# Patient Record
Sex: Female | Born: 1985 | Race: White | Hispanic: No | Marital: Married | Smoking: Never smoker
Health system: Southern US, Academic
[De-identification: ages and names within clinical notes are randomized; demographics above are authoritative.]

## PROBLEM LIST (undated history)

## (undated) DIAGNOSIS — G500 Trigeminal neuralgia: Secondary | ICD-10-CM

## (undated) DIAGNOSIS — K219 Gastro-esophageal reflux disease without esophagitis: Secondary | ICD-10-CM

## (undated) DIAGNOSIS — E079 Disorder of thyroid, unspecified: Secondary | ICD-10-CM

## (undated) DIAGNOSIS — E039 Hypothyroidism, unspecified: Secondary | ICD-10-CM

## (undated) DIAGNOSIS — C801 Malignant (primary) neoplasm, unspecified: Secondary | ICD-10-CM

## (undated) DIAGNOSIS — E119 Type 2 diabetes mellitus without complications: Secondary | ICD-10-CM

## (undated) DIAGNOSIS — G4730 Sleep apnea, unspecified: Secondary | ICD-10-CM

## (undated) DIAGNOSIS — J45909 Unspecified asthma, uncomplicated: Secondary | ICD-10-CM

## (undated) DIAGNOSIS — G8929 Other chronic pain: Secondary | ICD-10-CM

## (undated) HISTORY — DX: Hypothyroidism, unspecified: E03.9

## (undated) HISTORY — PX: HX LAP CHOLECYSTECTOMY: SHX56

## (undated) HISTORY — PX: HX TONSILLECTOMY: SHX27

---
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
## 2019-09-23 ENCOUNTER — Ambulatory Visit (INDEPENDENT_AMBULATORY_CARE_PROVIDER_SITE_OTHER): Payer: Self-pay

## 2020-11-15 ENCOUNTER — Other Ambulatory Visit (HOSPITAL_COMMUNITY): Payer: Self-pay | Admitting: PHYSICIAN ASSISTANT

## 2020-11-15 DIAGNOSIS — G500 Trigeminal neuralgia: Secondary | ICD-10-CM

## 2022-03-09 ENCOUNTER — Other Ambulatory Visit (HOSPITAL_COMMUNITY): Payer: Self-pay

## 2022-03-09 ENCOUNTER — Ambulatory Visit
Admission: RE | Admit: 2022-03-09 | Discharge: 2022-03-09 | Disposition: A | Discharge status: Home / Self Care | Payer: Medicaid Other | Source: Ambulatory Visit

## 2022-03-09 ENCOUNTER — Other Ambulatory Visit: Payer: Self-pay

## 2022-03-09 DIAGNOSIS — E039 Hypothyroidism, unspecified: Secondary | ICD-10-CM | POA: Insufficient documentation

## 2022-03-09 DIAGNOSIS — G4733 Obstructive sleep apnea (adult) (pediatric): Secondary | ICD-10-CM | POA: Insufficient documentation

## 2022-03-12 NOTE — Procedures (Unsigned)
Meghan Garner, Meghan Garner   HOSPITAL NUMBER:  [id number]  DATE OF SERVICE:  03/09/2022  DOB:  [birth year]  SEX:  F      PULMONARY FUNCTION TEST    SPIROMETRY:  Spirometry revealed normal flow vital capacity of 82% of predicted with normal flow rate of 86% with normal FEV1 of 2.8 liters.    LUNG VOLUMES:  Lung volumes are essentially unremarkable.    LUNG DIFFUSION:  Diffusion capacity is within normal limits with DLCO of 92% of predicted.    Flow-volume loop is essentially unremarkable.    IMPRESSION:  In conclusion, pulmonary function test is essentially unremarkable with normal FEV1 of 2.8 liters.        Renaye Rakers, MD            DD:  03/11/2022 13:08:21  DT:  03/12/2022 09:09:07 NW  D#:  [id number]

## 2022-05-05 ENCOUNTER — Other Ambulatory Visit (HOSPITAL_COMMUNITY): Payer: Self-pay | Admitting: Family Medicine

## 2022-05-05 ENCOUNTER — Other Ambulatory Visit: Payer: Self-pay

## 2022-05-05 DIAGNOSIS — R946 Abnormal results of thyroid function studies: Secondary | ICD-10-CM

## 2022-05-15 ENCOUNTER — Ambulatory Visit
Admission: RE | Admit: 2022-05-15 | Discharge: 2022-05-15 | Disposition: A | Discharge status: Home / Self Care | Payer: Medicaid Other | Source: Ambulatory Visit | Attending: Family Medicine | Admitting: Family Medicine

## 2022-05-15 ENCOUNTER — Ambulatory Visit (HOSPITAL_COMMUNITY)
Admission: RE | Admit: 2022-05-15 | Discharge: 2022-05-15 | Disposition: A | Discharge status: Home / Self Care | Payer: Medicaid Other | Source: Ambulatory Visit | Attending: Family Medicine | Admitting: Family Medicine

## 2022-05-15 ENCOUNTER — Ambulatory Visit (HOSPITAL_COMMUNITY): Payer: Self-pay

## 2022-05-15 ENCOUNTER — Other Ambulatory Visit: Payer: Self-pay

## 2022-05-15 DIAGNOSIS — R946 Abnormal results of thyroid function studies: Secondary | ICD-10-CM | POA: Insufficient documentation

## 2022-05-16 ENCOUNTER — Ambulatory Visit
Admission: RE | Admit: 2022-05-16 | Discharge: 2022-05-16 | Disposition: A | Discharge status: Home / Self Care | Payer: Medicaid Other | Source: Ambulatory Visit | Attending: Family Medicine | Admitting: Family Medicine

## 2022-05-16 DIAGNOSIS — R946 Abnormal results of thyroid function studies: Secondary | ICD-10-CM

## 2022-09-05 ENCOUNTER — Ambulatory Visit (HOSPITAL_COMMUNITY): Payer: Self-pay | Admitting: NURSE PRACTITIONER

## 2022-09-21 ENCOUNTER — Ambulatory Visit
Admission: RE | Admit: 2022-09-21 | Discharge: 2022-09-21 | Disposition: A | Discharge status: Home / Self Care | Payer: Medicaid Other | Source: Ambulatory Visit | Attending: Family Medicine | Admitting: Family Medicine

## 2022-09-21 ENCOUNTER — Other Ambulatory Visit: Payer: Self-pay

## 2022-09-21 ENCOUNTER — Other Ambulatory Visit (HOSPITAL_COMMUNITY): Payer: Self-pay | Admitting: Family Medicine

## 2022-09-21 DIAGNOSIS — R059 Cough, unspecified: Secondary | ICD-10-CM

## 2022-09-21 DIAGNOSIS — R0602 Shortness of breath: Secondary | ICD-10-CM

## 2023-05-31 ENCOUNTER — Telehealth (HOSPITAL_BASED_OUTPATIENT_CLINIC_OR_DEPARTMENT_OTHER): Payer: Self-pay | Admitting: NURSE PRACTITIONER

## 2023-05-31 NOTE — Telephone Encounter (Signed)
Refill Request:    Pt states they need a PA for both since her insurance has changed to Medicare.     Armour Thyroid 120 mg &     Armour Thyroid 30 mg     Patient states she has tried others (Levothyroxine, Synthroid, MP Thyroid but they did not work for her. She does not want to go back to those.   Kaslyn states she has 5 remaining doses remaining.    Pt can be reached @ [phone]

## 2023-06-05 NOTE — Telephone Encounter (Signed)
Prior authorization has been submitted via cover my meds for both the Armour Thyroid 120mg  and 30 mg Tablets. Called the pharmacy to get billing insurance information for her new insurance. RXBIN: [id number], RXPCN: [id number], [id number]: [id number], ID# [id number]. Waiting on response from the insurance.

## 2023-06-12 NOTE — Telephone Encounter (Signed)
Insurance will not cover the Armour Thyroid. It was denied due to being a plan exclusion. I know the patient has tried other brands as well. Do you want to switch her medication? I also spoke with the pharmacy just to see if there were any coupons that the patient could use, unfortunately there is not. Where she has medicare, it knocks her out of any of those coupons/discounts.

## 2023-06-17 NOTE — Telephone Encounter (Signed)
I tried to call patient and could not reach her. She has not been seen since 08/2022. No recent labwork. Insurance denied Armour Thyroid. Please try to move her appointment up, she is scheduled in August. Call her to see if she has enough medication to last her until appointment. Thanks.

## 2023-08-15 ENCOUNTER — Other Ambulatory Visit (HOSPITAL_BASED_OUTPATIENT_CLINIC_OR_DEPARTMENT_OTHER): Payer: Self-pay | Admitting: NURSE PRACTITIONER

## 2023-08-15 DIAGNOSIS — E039 Hypothyroidism, unspecified: Secondary | ICD-10-CM

## 2023-08-16 ENCOUNTER — Encounter (HOSPITAL_BASED_OUTPATIENT_CLINIC_OR_DEPARTMENT_OTHER): Payer: Self-pay | Admitting: NURSE PRACTITIONER

## 2023-08-16 ENCOUNTER — Inpatient Hospital Stay: Admission: RE | Admit: 2023-08-16 | Payer: Medicare Other | Source: Ambulatory Visit

## 2023-08-16 ENCOUNTER — Other Ambulatory Visit: Payer: Self-pay

## 2023-08-16 DIAGNOSIS — K219 Gastro-esophageal reflux disease without esophagitis: Secondary | ICD-10-CM | POA: Insufficient documentation

## 2023-08-16 DIAGNOSIS — I10 Essential (primary) hypertension: Secondary | ICD-10-CM | POA: Insufficient documentation

## 2023-08-16 DIAGNOSIS — R300 Dysuria: Secondary | ICD-10-CM | POA: Insufficient documentation

## 2023-08-16 DIAGNOSIS — J329 Chronic sinusitis, unspecified: Secondary | ICD-10-CM | POA: Insufficient documentation

## 2023-08-16 DIAGNOSIS — J342 Deviated nasal septum: Secondary | ICD-10-CM | POA: Insufficient documentation

## 2023-08-16 DIAGNOSIS — E039 Hypothyroidism, unspecified: Secondary | ICD-10-CM | POA: Insufficient documentation

## 2023-08-16 DIAGNOSIS — J309 Allergic rhinitis, unspecified: Secondary | ICD-10-CM | POA: Insufficient documentation

## 2023-08-16 DIAGNOSIS — N920 Excessive and frequent menstruation with regular cycle: Secondary | ICD-10-CM | POA: Insufficient documentation

## 2023-08-16 DIAGNOSIS — M1990 Unspecified osteoarthritis, unspecified site: Secondary | ICD-10-CM | POA: Insufficient documentation

## 2023-08-16 DIAGNOSIS — J343 Hypertrophy of nasal turbinates: Secondary | ICD-10-CM | POA: Insufficient documentation

## 2023-08-16 DIAGNOSIS — F32A Depression, unspecified: Secondary | ICD-10-CM | POA: Insufficient documentation

## 2023-08-17 ENCOUNTER — Encounter (HOSPITAL_BASED_OUTPATIENT_CLINIC_OR_DEPARTMENT_OTHER): Payer: Self-pay | Admitting: NURSE PRACTITIONER

## 2023-08-17 ENCOUNTER — Ambulatory Visit (HOSPITAL_BASED_OUTPATIENT_CLINIC_OR_DEPARTMENT_OTHER): Payer: Self-pay | Admitting: NURSE PRACTITIONER

## 2023-08-17 VITALS — BP 160/91 | HR 102 | Temp 98.2°F | Resp 16 | Ht 67.0 in | Wt 323.0 lb

## 2023-08-17 DIAGNOSIS — R000 Tachycardia, unspecified: Secondary | ICD-10-CM

## 2023-08-17 DIAGNOSIS — E039 Hypothyroidism, unspecified: Secondary | ICD-10-CM | POA: Insufficient documentation

## 2023-08-17 DIAGNOSIS — E559 Vitamin D deficiency, unspecified: Secondary | ICD-10-CM | POA: Insufficient documentation

## 2023-08-17 DIAGNOSIS — Z6841 Body Mass Index (BMI) 40.0 and over, adult: Secondary | ICD-10-CM | POA: Insufficient documentation

## 2023-08-17 DIAGNOSIS — E042 Nontoxic multinodular goiter: Secondary | ICD-10-CM | POA: Insufficient documentation

## 2023-08-17 DIAGNOSIS — I10 Essential (primary) hypertension: Secondary | ICD-10-CM | POA: Insufficient documentation

## 2023-08-17 DIAGNOSIS — Z79890 Hormone replacement therapy (postmenopausal): Secondary | ICD-10-CM

## 2023-08-17 NOTE — Progress Notes (Signed)
[facility]  [address]  [place] [address]  Operated by [facility]    Name: Meghan Garner MRN:  [id number]   Date: 08/17/2023 DOB:  1986/04/06 (36 y.o.)     PCP: Myra Gianotti Hill-Hurt, PA-C  Referring Provider: Mariah Milling     Chief Complaint: Hypothyroidism (Last ov 09/05/22)    HPI    Last appointment was 09/05/2022. Taking Armour Thyroid 120 mg. Daily. Receiving from company through PCP. Unable to get Armour Thyroid 120 mg. and 30 mg. Prior Berkley Harvey was denied. She says she has been taking thyroid medication since she was 37 years old. She says she has taken Synthroid, Levothyroxine, NP Thyroid in the past, prefers Armour Thyroid but is willing to change since unable to get dosage she needs from company. PCP is [facility]. Thyroid scan and uptake ordered by her PCP 04/2022 showing activity in right thyroid lobe greater than the left, decreased activity right mid gland. Low radioiodine uptake at all time points, 4.5% at 24 hours. Patient states she thinks she stopped taking Armour thyroid prior to scan and uptake. TSH 16.800, free T4 0.4 from 05/2023.     Thyroid ultrasound 07/2023 showing 1.6 cm. Thyroid nodule right side, this is her first ultrasound. Also, Nearly isoechoic 1.1 and 1.1 cm nodules are seen in the right lower thyroid gland. Similar appearing 1.0 cm solid nearly isoechoic nodule in the left lower gland. She denies family history of thyroid cancer and denies prolonged radiation exposure to her neck.     HgbA1C 6.1% from 07/2022. HgbA1C 5.7% from 05/2023. She says PCP has been managing her Diabetes. Taking Metformin 500 mg. ER daily.        No recent Vitamin D level. She says she has taken Vitamin D 50,000 units weekly in the past.     Past Medical History:  Past Medical History:   Diagnosis Date    Hypothyroid          Past Surgical History:   Procedure Laterality Date    HX LAP CHOLECYSTECTOMY      HX TONSILLECTOMY           Baclofen  (LIORESAL) 20 mg Oral Tablet, Take 1 Tablet (20 mg total) by mouth Three times a day  buPROPion (WELLBUTRIN SR) 150 mg Oral tablet sustained-release 12 hr, Take 1 Tablet (150 mg total) by mouth Twice daily  OXcarbazepine (TRILEPTAL) 300 mg Oral Tablet, ON DAY 1-3 TAKE 1 CAPSULE BY MOUTH TWICE A DAY, ON DAY 4-6 TAKE 1 CAPSULE IN THE AM AND 2 CAPS IN THE EVENING, ON DAY 7-9 TAKE 2 CAPS TWICE A DAY, ON DAY 10-12 TAKE 2 CAPS IN THE AM AND 3 CAPS IN THE EVENING, ON DAY 13 AND BEYOND TAKE 3 CAPS TWICE A DAY  pantoprazole (PROTONIX) 40 mg Oral Tablet, Delayed Release (E.C.), Take 1 Tablet (40 mg total) by mouth Once a day  spironolactone (ALDACTONE) 50 mg Oral Tablet, Take 1 Tablet (50 mg total) by mouth Every morning with breakfast  thyroid (ARMOUR THYROID) 120 mg Oral Tablet, Take 1 Tablet (120 mg total) by mouth Once a day  cholecalciferol, vitamin D3, 25 mcg (1,000 unit) Oral Tablet, Take 1 Tablet (1,000 Units total) by mouth Once a day (Patient not taking: Reported on 08/17/2023)  gabapentin (NEURONTIN) 400 mg Oral Capsule, Take 1 Capsule (400 mg total) by mouth (Patient not taking: Reported on 08/17/2023)  Metformin (GLUMETZA) 500 mg Oral Tablet,SR,Gast.Retention,24 hr, Take 1  Tablet (500 mg total) by mouth Once a day (Patient not taking: Reported on 08/17/2023)  Minocycline (DYNACIN) 75 mg Oral Tablet, Take 1 Tablet (75 mg total) by mouth Once a day (Patient not taking: Reported on 08/17/2023)  montelukast (SINGULAIR) 10 mg Oral Tablet, Take 1 Tablet (10 mg total) by mouth Every evening (Patient not taking: Reported on 08/17/2023)  propranoloL (INDERAL) 40 mg Oral Tablet, Take 1 Tablet (40 mg total) by mouth Three times a day (Patient not taking: Reported on 08/17/2023)  thyroid (ARMOUR THYROID) 30 mg Oral Tablet, Take 1 Tablet (30 mg total) by mouth Once a day (Patient not taking: Reported on 08/17/2023)    No facility-administered medications prior to visit.     Allergies   Allergen Reactions    Cephalosporins  Anaphylaxis    Penicillins Anaphylaxis    Metformin Rash    Sulfa (Sulfonamides) Rash     Family Medical History:    None         Social History     Socioeconomic History    Marital status: Married     Spouse name: Not on file    Number of children: Not on file    Years of education: Not on file    Highest education level: Not on file   Occupational History    Not on file   Tobacco Use    Smoking status: Not on file    Smokeless tobacco: Not on file   Substance and Sexual Activity    Alcohol use: Not on file    Drug use: Not on file    Sexual activity: Not on file   Other Topics Concern    Not on file   Social History Narrative    Not on file     Social Determinants of Health     Financial Resource Strain: Not on file   Transportation Needs: Not on file   Social Connections: Not on file   Intimate Partner Violence: Not on file   Housing Stability: Not on file        There is no immunization history on file for this patient.    Review of Systems - See HPI       Physical Exam  Constitutional:       Appearance: Normal appearance.   HENT:      Head: Normocephalic and atraumatic.   Eyes:      Conjunctiva/sclera: Conjunctivae normal.   Cardiovascular:      Rate and Rhythm: Regular rhythm. Tachycardia present.      Pulses: Normal pulses.      Heart sounds: Normal heart sounds.   Pulmonary:      Effort: Pulmonary effort is normal.      Breath sounds: Normal breath sounds.   Musculoskeletal:         General: Normal range of motion.   Skin:     General: Skin is warm and dry.   Neurological:      General: No focal deficit present.      Mental Status: She is oriented to person, place, and time.   Psychiatric:         Mood and Affect: Mood normal.         Behavior: Behavior normal.          Vitals:   Vitals:    08/17/23 1039   BP: (!) 160/91   Pulse: (!) 102   Resp: 16   Temp: 36.8 C (98.2 F)   SpO2:  97%   Weight: (!) 147 kg (323 lb)   Height: 1.702 m (5\' 7" )   BMI: 50.69     Assessment and Plan:     Monitor BP and HR at home.  Follow up with PCP if BP and HR remain elevated. Labwork ASAP and before next appointment. Discussed changing to Levothyroxine and Liothyronine or changing to higher dose and cutting pill in half, pending lab results. Referral to Dr. Waymon Budge for FNA of thyroid nodule. Work on [employer] and exercise.     Encounter Diagnoses   Name Primary?    Hypothyroidism, unspecified type Yes    Morbid obesity with BMI of 50.0-59.9, adult (CMS [facility])     Essential hypertension     Vitamin D deficiency     Multiple thyroid nodules         Return in about 3 months (around 11/17/2023).      Wilmer Floor, FNP-C  08/17/2023, 10:52

## 2023-08-17 NOTE — Patient Instructions (Signed)
Monitor BP and HR at home. Follow up with PCP if BP and HR remain elevated. Labwork ASAP and before next appointment. Discussed changing to Levothyroxine and Liothyronine or changing to higher dose and cutting pill in half, pending lab results. Referral to Dr. Waymon Budge for FNA of thyroid nodule. Work on [employer] and exercise.

## 2023-08-20 ENCOUNTER — Other Ambulatory Visit: Payer: Medicare Other | Attending: NURSE PRACTITIONER

## 2023-08-20 ENCOUNTER — Other Ambulatory Visit: Payer: Self-pay

## 2023-08-20 ENCOUNTER — Other Ambulatory Visit (HOSPITAL_BASED_OUTPATIENT_CLINIC_OR_DEPARTMENT_OTHER): Payer: Self-pay | Admitting: INTERNAL MEDICINE-ENDOCRINOLOGY-DIABETES AND METABOLISM

## 2023-08-20 DIAGNOSIS — E039 Hypothyroidism, unspecified: Secondary | ICD-10-CM | POA: Insufficient documentation

## 2023-08-20 DIAGNOSIS — E559 Vitamin D deficiency, unspecified: Secondary | ICD-10-CM | POA: Insufficient documentation

## 2023-08-20 DIAGNOSIS — Z6841 Body Mass Index (BMI) 40.0 and over, adult: Secondary | ICD-10-CM | POA: Insufficient documentation

## 2023-08-20 LAB — THYROID STIMULATING HORMONE WITH FREE T4 REFLEX: TSH: 23.065 u[IU]/mL — ABNORMAL HIGH (ref 0.450–5.330)

## 2023-08-20 LAB — THYROXINE, FREE (FREE T4): THYROXINE (T4), FREE: <0.25 ng/dL — ABNORMAL LOW (ref 0.58–1.64)

## 2023-08-20 LAB — VITAMIN D 25 TOTAL: VITAMIN D 25, TOTAL: 35.39 ng/mL (ref 30.00–100.00)

## 2023-08-22 LAB — T3 (TRIIODOTHYRONINE), FREE, SERUM: T3 FREE: 2.4 pg/mL (ref 1.7–3.7)

## 2023-08-26 ENCOUNTER — Other Ambulatory Visit (HOSPITAL_BASED_OUTPATIENT_CLINIC_OR_DEPARTMENT_OTHER): Payer: Self-pay | Admitting: NURSE PRACTITIONER

## 2023-08-26 MED ORDER — THYROID (PORK) 300 MG TABLET
150.0000 mg | ORAL_TABLET | Freq: Every day | ORAL | Status: DC
Start: 2023-08-26 — End: 2023-11-30

## 2023-08-26 MED ORDER — ERGOCALCIFEROL (VITAMIN D2) 1,250 MCG (50,000 UNIT) CAPSULE
50000.0000 [IU] | ORAL_CAPSULE | ORAL | 1 refills | Status: AC
Start: 2023-08-26 — End: ?

## 2023-09-20 ENCOUNTER — Encounter (HOSPITAL_BASED_OUTPATIENT_CLINIC_OR_DEPARTMENT_OTHER): Payer: Self-pay | Admitting: NURSE PRACTITIONER

## 2023-09-26 ENCOUNTER — Other Ambulatory Visit (HOSPITAL_COMMUNITY): Payer: Self-pay | Admitting: Family Medicine

## 2023-09-26 DIAGNOSIS — G500 Trigeminal neuralgia: Secondary | ICD-10-CM

## 2023-09-27 ENCOUNTER — Other Ambulatory Visit (HOSPITAL_COMMUNITY): Payer: Self-pay | Admitting: Family Medicine

## 2023-09-27 DIAGNOSIS — G500 Trigeminal neuralgia: Secondary | ICD-10-CM

## 2023-10-08 IMAGING — MR MRI BRAIN W/O CONTRAST
8 of 10 series · 34 of 48 positions shown · IV contrast (gadolinium)
Comparison: None available.

﻿EXAM:  MRI BRAIN W/O CONTRAST
INDICATION: Trigeminal neuralgia.  Headaches.
TECHNIQUE: Multiplanar, multisequential MRI of the brain was performed without gadolinium contrast.

[Series 6: DWI · axial · 6.0mm · 1.35mm/px · z∈[-82,+72]mm · 9 of 92 slices shown (1 of 3)]
[im 1/92]
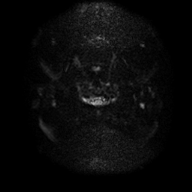
[im 16/92]
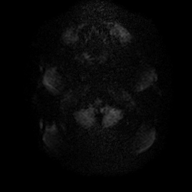
[im 31/92]
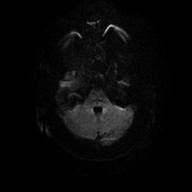
[im 38/92]
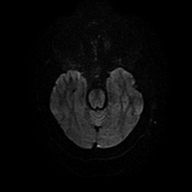
[im 46/92]
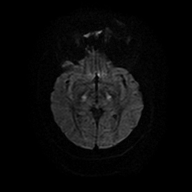
[im 54/92]
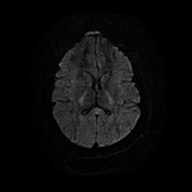
[im 61/92]
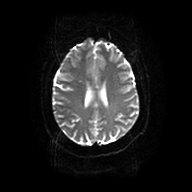
[im 76/92]
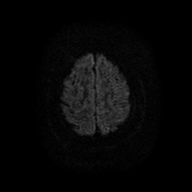
[im 92/92]
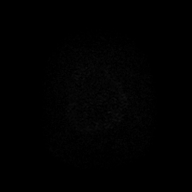

[Series 7: DWI · axial · 6.0mm · 1.35mm/px · z∈[-82,+72]mm · 3 of 23 slices shown (2 of 3)]
[im 1/23]
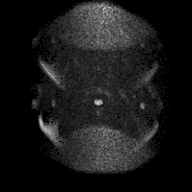
[im 12/23]
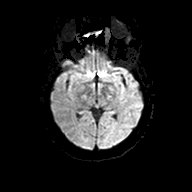
[im 23/23]
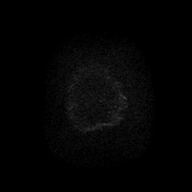

[Series 8: DWI · axial · 6.0mm · 1.35mm/px · z∈[-82,+72]mm · 3 of 23 slices shown (3 of 3)]
[im 1/23]
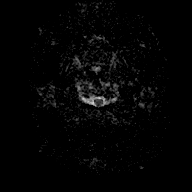
[im 12/23]
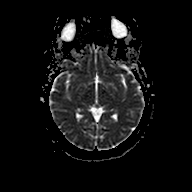
[im 23/23]
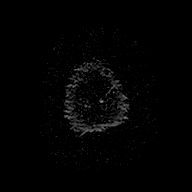

[Series 9: FLAIR · sagittal · 4.5mm · 0.75mm/px · 3 of 26 slices shown (1 of 2)]
[im 1/26]
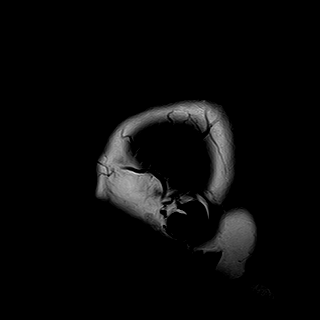
[im 13/26]
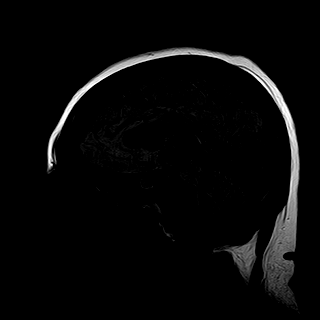
[im 26/26]
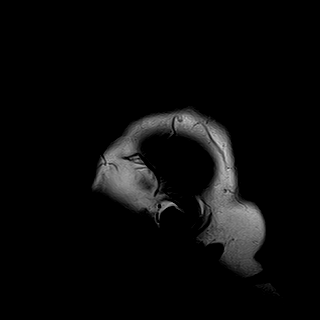

[Series 10: T2 · axial · 4.5mm · 0.43mm/px · z∈[-86,+71]mm · 5 of 36 slices shown]
[im 1/36]
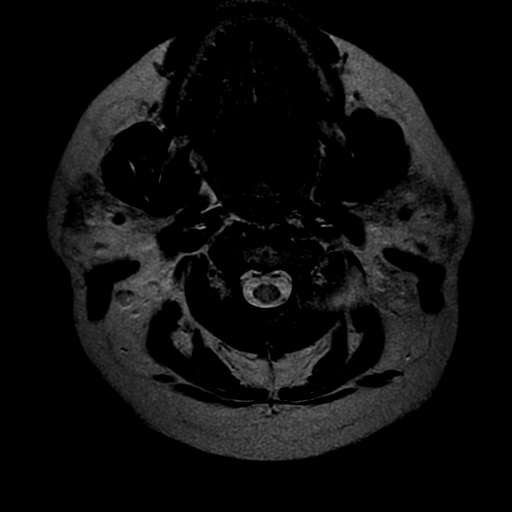
[im 9/36]
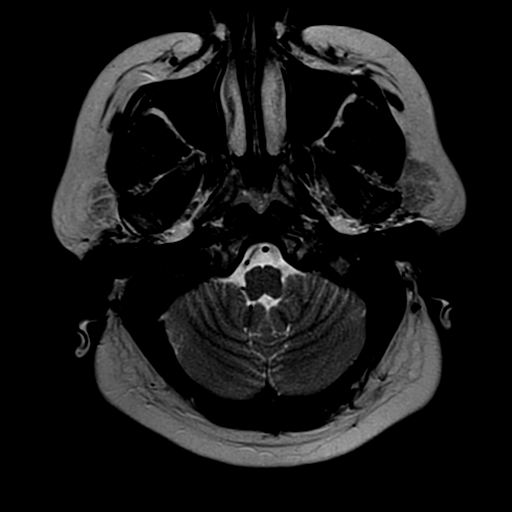
[im 18/36]
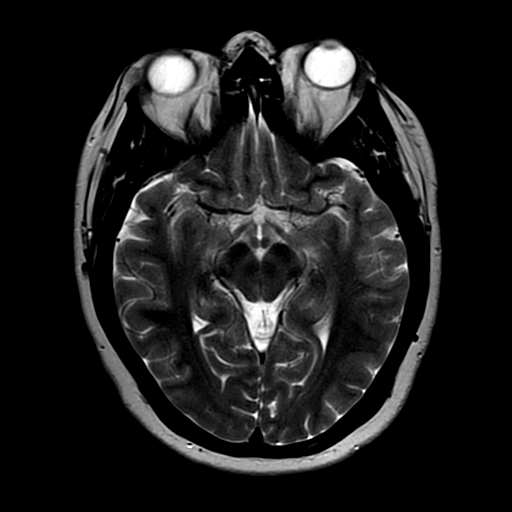
[im 27/36]
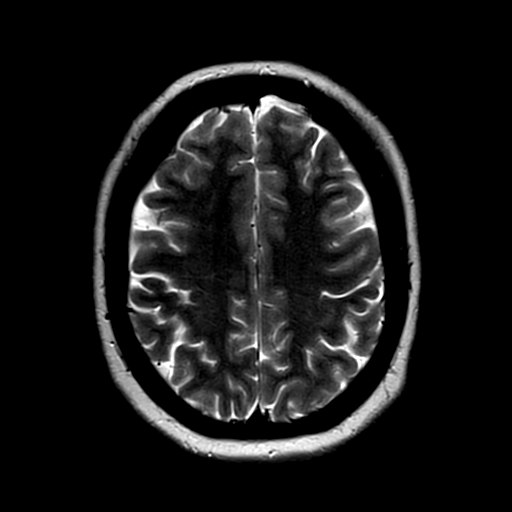
[im 36/36]
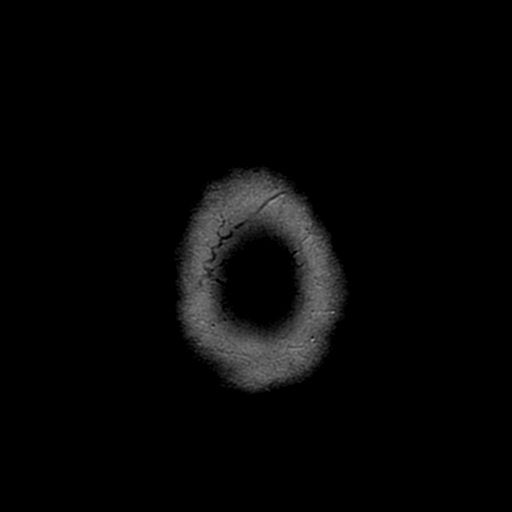

[Series 11: FLAIR · axial · 4.5mm · 0.43mm/px · z∈[-83,+75]mm · 5 of 36 slices shown (2 of 2)]
[im 1/36]
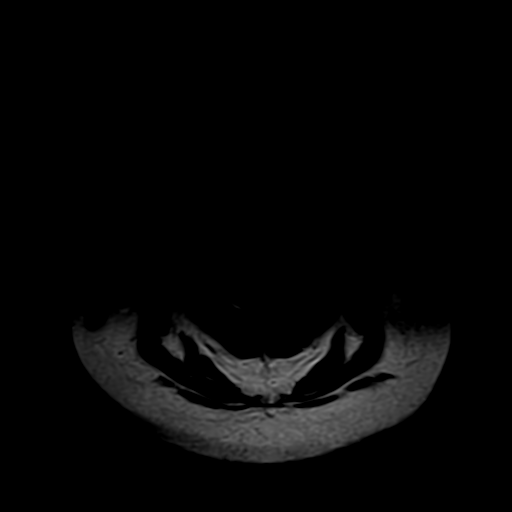
[im 9/36]
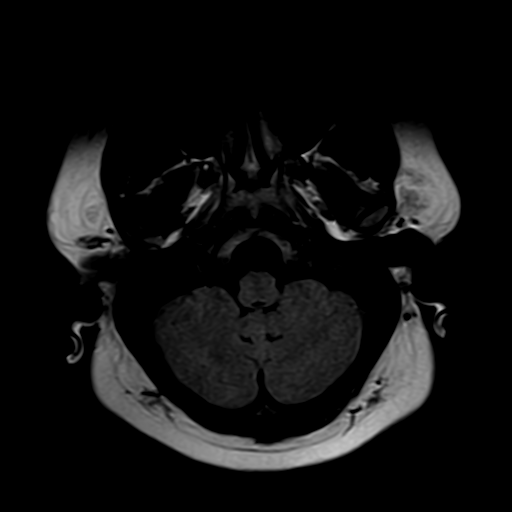
[im 18/36]
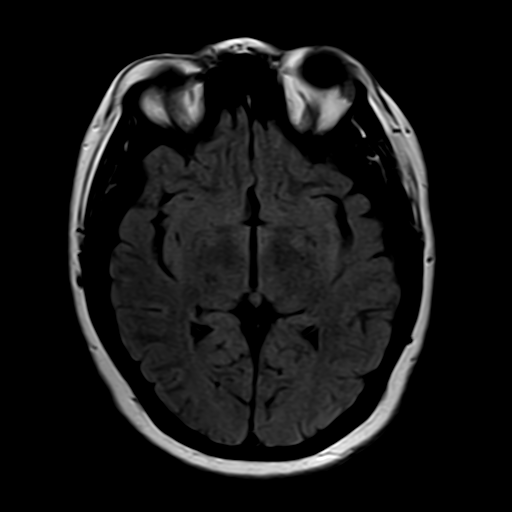
[im 27/36]
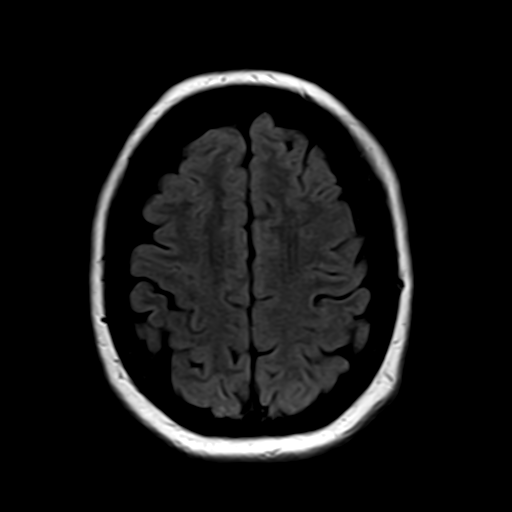
[im 36/36]
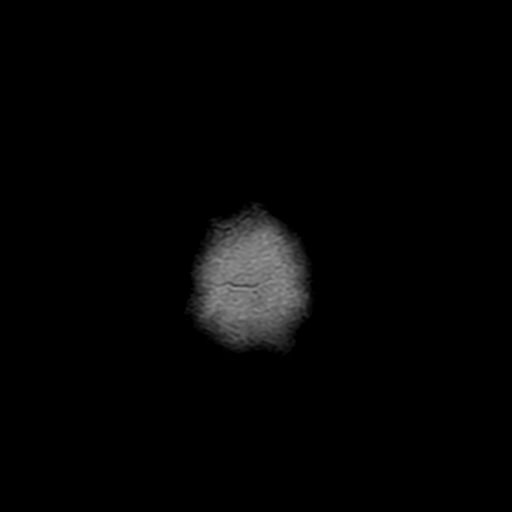

[Series 12: T1 · axial · 4.5mm · 0.43mm/px · z∈[-89,+68]mm · 5 of 36 slices shown (1 of 2)]
[im 1/36]
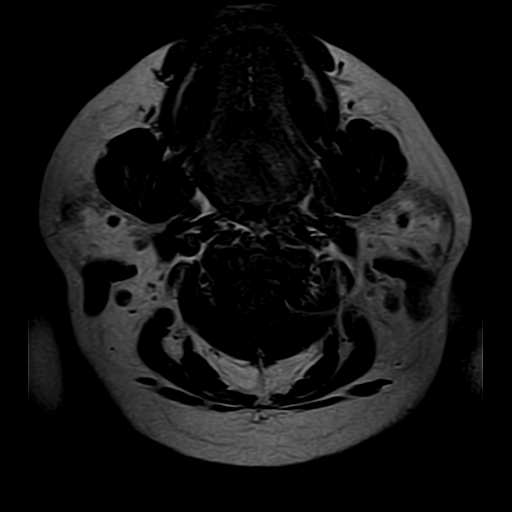
[im 9/36]
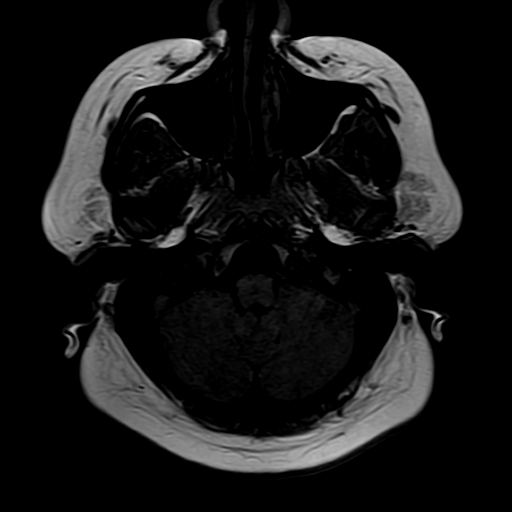
[im 18/36]
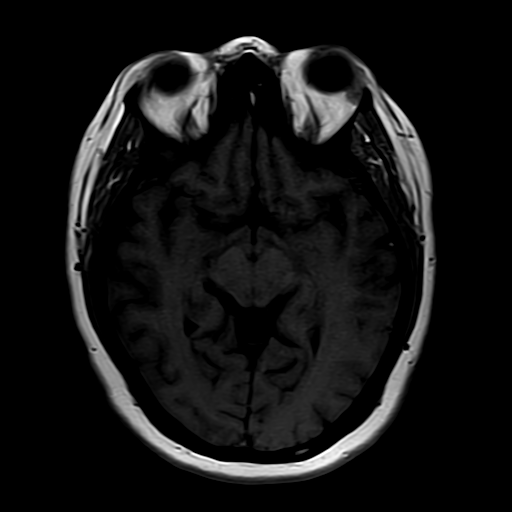
[im 27/36]
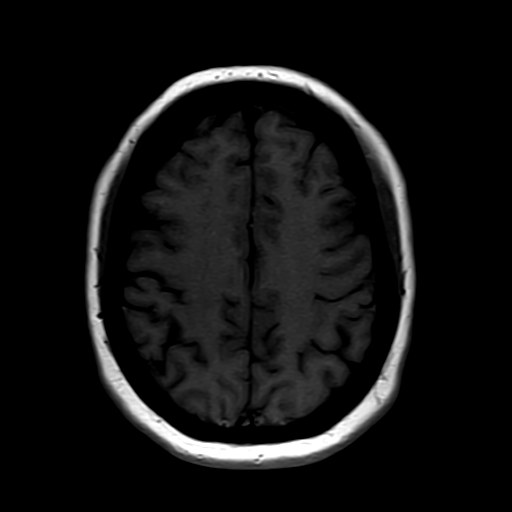
[im 36/36]
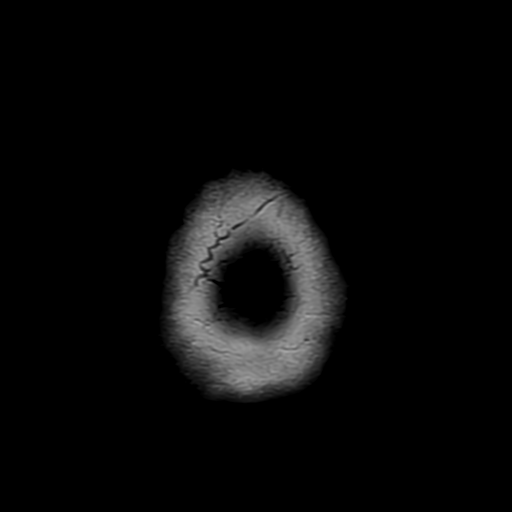

[Series 15: T1 · axial · 3.0mm · 0.47mm/px · 1 of 11 slices shown (2 of 2)]
[im 1/11]
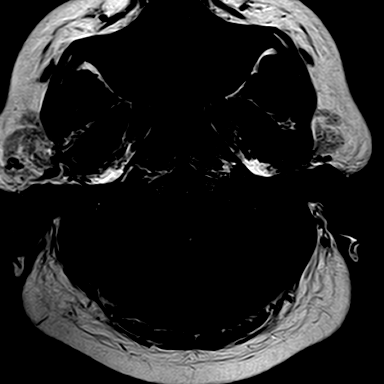

[34 of 48 positions shown; findings below may reference images not displayed]

FINDINGS: There is no evidence of mass, hemorrhage, shift of the midline structures, or extra-axial collection.  The ventricles and CSF spaces appear within normal limits. The visualized cranial nerves appear unremarkable.  

The paranasal sinuses and mastoid air cells appear clear.  The orbits appear unremarkable.
IMPRESSION: Normal examination.

## 2023-10-12 ENCOUNTER — Ambulatory Visit: Payer: Medicare Other | Attending: GENERAL SURGERY | Admitting: GENERAL SURGERY

## 2023-10-12 ENCOUNTER — Ambulatory Visit (HOSPITAL_BASED_OUTPATIENT_CLINIC_OR_DEPARTMENT_OTHER): Payer: Medicare Other

## 2023-10-12 ENCOUNTER — Encounter (HOSPITAL_BASED_OUTPATIENT_CLINIC_OR_DEPARTMENT_OTHER): Payer: Self-pay | Admitting: GENERAL SURGERY

## 2023-10-12 ENCOUNTER — Other Ambulatory Visit: Payer: Self-pay

## 2023-10-12 VITALS — BP 136/80 | HR 89 | Temp 98.7°F | Resp 18 | Ht 67.0 in | Wt 334.2 lb

## 2023-10-12 DIAGNOSIS — E042 Nontoxic multinodular goiter: Secondary | ICD-10-CM | POA: Insufficient documentation

## 2023-10-12 NOTE — H&P (Signed)
[facility]  [address]  [place] [address]  Operated by [facility]     Name: Meghan Garner MRN:  [id number]   Date of Birth: [birth year] Age: 37 y.o.   Date: 10/12/2023       Provider: Nicoletta Dress, MD    PCP: Myra Gianotti Hill-Hurt, PA-C    Referring Provider: Wilmer Floor     Reason for visit: Thyroid Nodule(s) (New patient. Thyroid US and possible FNA. )      HPI:  Nursing Notes:   Vergia Alberts, MA  10/12/23 1236  Signed  Meghan Garner is a 37 year old female who is here today as a new patient for evaluation of thyroid nodules by ultrasound and possible FNA. The patient was referred to our office by  Kathlyn [place]. The patient states that her PCP sent her for an ultrasound due to her being diagnosed with hypothyroidism. The patient is currently taking Armorthyroid 150.    The patient admits to fatigue and weight gain.  The patient denies heat or cold intolerance, choking sensations, bone pain, bone fractures, head or neck radiation. The patient admits to family history of thyroid cancer in her paternal great grandmother.     The patient had a recent thyroid ultrasound performed 08/16/23. The findings of that ultrasound were:    Right - 5.3 x 1.8 x 1.5 cm  Multiple nodules bilaterally.    Left - 3.7 x 1.3 x 1.3 cm    Isthmus - 4 mm    Right:    17 x 13 x 11 mm - complex  11 x 6 x 7 mm - Hypoechoic  11 x 11 x 6 mm - solid  10 x 12 x 6 mm - solid    Left:    11 x 8 x 7 mm - solid  10 x 6 x 8 mm - solid    Patients most recent blood work:    TSH - 15.8  T4 - 3.9  T3 - 11  Free thyroxine - 0.4     Past Medical History:  Past Medical History:   Diagnosis Date    Hypothyroid      Past Surgical History:   Procedure Laterality Date    HX LAP CHOLECYSTECTOMY      HX TONSILLECTOMY        Allergies   Allergen Reactions    Cephalosporins Anaphylaxis    Penicillins Anaphylaxis    Metformin Rash    Sulfa (Sulfonamides) Rash     Current Outpatient  Medications   Medication Sig    Baclofen (LIORESAL) 20 mg Oral Tablet Take 1 Tablet (20 mg total) by mouth Three times a day    buPROPion (WELLBUTRIN SR) 150 mg Oral tablet sustained-release 12 hr Take 1 Tablet (150 mg total) by mouth Twice daily    ergocalciferol, vitamin D2, (DRISDOL) 1,250 mcg (50,000 unit) Oral Capsule Take 1 Capsule (50,000 Units total) by mouth Every 7 days    OXcarbazepine (TRILEPTAL) 300 mg Oral Tablet ON DAY 1-3 TAKE 1 CAPSULE BY MOUTH TWICE A DAY, ON DAY 4-6 TAKE 1 CAPSULE IN THE AM AND 2 CAPS IN THE EVENING, ON DAY 7-9 TAKE 2 CAPS TWICE A DAY, ON DAY 10-12 TAKE 2 CAPS IN THE AM AND 3 CAPS IN THE EVENING, ON DAY 13 AND BEYOND TAKE 3 CAPS TWICE A DAY    pantoprazole (PROTONIX) 40 mg Oral Tablet, Delayed Release (E.C.) Take 1 Tablet (40  mg total) by mouth Once a day    spironolactone (ALDACTONE) 50 mg Oral Tablet Take 1 Tablet (50 mg total) by mouth Every morning with breakfast    thyroid (ARMOUR THYROID) 300 mg Oral Tablet Take 0.5 Tablets (150 mg total) by mouth Once a day     Family History       Problem Relation Age of Onset Comments    Thyroid Disease Paternal Grandmother      Thyroid nodules Paternal Grandmother             Social History     Socioeconomic History    Marital status: Married   Tobacco Use    Smoking status: Never    Smokeless tobacco: Never   Substance and Sexual Activity    Alcohol use: Never    Drug use: Never       Review of System  The pertinent ROS as addressed in the HPI.    Vital Signs:  Temperature: 37.1 C (98.7 F)  Heart Rate: 89  BP (Non-Invasive): 136/80  Respiratory Rate: 18  SpO2: 98 %      Physical Exam:  Physical Exam      GENERAL  Constitutional: no acute distress, average body habitus and cooperative.  Speech clear and appropriate.  HEENT  Eye: Present EOMI, PERRL, normal accomodation and conjunctiva pink; Absent scleral icterus.  NECK  Neck: Present full ROM and norma inspection; Absent lymphedema or tenderness.  THYROID  No thyroid gland enlargement  and there is not palpable thyroid nodules   LUNGS  Respiratory: Clear to auscultation bilaterally. No wheezes or rhonchi. No accessory muscle use noted.  CV  Cardiovascular exam: Present RRR, S1 and S2; absent murmur, gallop, or rubs.  ABD  Abdominal: Abdomen soft, nontender. Good bowel sounds x 4. No organomegaly appreciated.  EXT  Extremities: Present full ROM, normal capillary refiill and normal inspection; Absent clubbing or pedal edema  NEUROLOGICAL: Cranial nerves grossly intact.   NEURO  Neurological:Present alert, oriented X 3 CN II-XII intact and norma lreflexes, moving all extremities, normal tone and normal speech  PSYC  Psychiatric: Present normal affect, normal thought process, cooperative, good insight and good judgement, Absent depressed, anxious or agitated.     Assessment:  ENCOUNTER DIAGNOSES     ICD-10-CM   1. Multinodular goiter  E04.2   2. Multiple thyroid nodules  E04.2         Clinic Ultrasound:  No results found for this or any previous visit (from the past 8765 hour(s)).       Plan:  Orders Placed This Encounter    CLINIC POCUS THYROID    [place] FNA THYROID       She is here for evaluation of thyroid nodules.  She has had history of hypothyroidism for several years.  She has had a recent ultrasound of the thyroid gland which identified the presence of multiple bilateral thyroid nodules.  She had multiple complex and solid nodules.  The neck examination showed neck which is large with obesity.  It is short and the ultrasound images were very dark and unclear however ultrasound of the thyroid was performed which identified presence of multiple nodules on both lobes of the thyroid gland.  The thyroid tissue were significantly heterogenous texture.  FNA was not performed as the images were dark and the nodules did not appears well defined for adequate targeting and adequate evaluation.  New line we discussed those findings and we recommended to do an ultrasound of the  thyroid gland in the hospital  for better imaging more accurate sampling.       No follow-ups on file.    Nicoletta Dress, MD

## 2023-10-12 NOTE — Nursing Note (Signed)
Meghan Garner is a 37 year old female who is here today as a new patient for evaluation of thyroid nodules by ultrasound and possible FNA. The patient was referred to our office by  Kathlyn [place]. The patient states that her PCP sent her for an ultrasound due to her being diagnosed with hypothyroidism. The patient is currently taking Armorthyroid 150.    The patient admits to fatigue and weight gain.  The patient denies heat or cold intolerance, choking sensations, bone pain, bone fractures, head or neck radiation. The patient admits to family history of thyroid cancer in her paternal great grandmother.     The patient had a recent thyroid ultrasound performed 08/16/23. The findings of that ultrasound were:    Right - 5.3 x 1.8 x 1.5 cm  Multiple nodules bilaterally.    Left - 3.7 x 1.3 x 1.3 cm    Isthmus - 4 mm    Right:    17 x 13 x 11 mm - complex  11 x 6 x 7 mm - Hypoechoic  11 x 11 x 6 mm - solid  10 x 12 x 6 mm - solid    Left:    11 x 8 x 7 mm - solid  10 x 6 x 8 mm - solid    Patients most recent blood work:    TSH - 15.8  T4 - 3.9  T3 - 11  Free thyroxine - 0.4

## 2023-11-06 ENCOUNTER — Other Ambulatory Visit: Payer: Self-pay

## 2023-11-06 ENCOUNTER — Other Ambulatory Visit (HOSPITAL_BASED_OUTPATIENT_CLINIC_OR_DEPARTMENT_OTHER): Payer: Self-pay | Admitting: GENERAL SURGERY

## 2023-11-06 DIAGNOSIS — E042 Nontoxic multinodular goiter: Secondary | ICD-10-CM

## 2023-11-06 DIAGNOSIS — E041 Nontoxic single thyroid nodule: Secondary | ICD-10-CM

## 2023-11-09 ENCOUNTER — Other Ambulatory Visit: Payer: Self-pay

## 2023-11-09 ENCOUNTER — Inpatient Hospital Stay
Admission: RE | Admit: 2023-11-09 | Discharge: 2023-11-09 | Disposition: A | Discharge status: Home / Self Care | Payer: Medicare Other | Source: Ambulatory Visit | Attending: GENERAL SURGERY | Admitting: GENERAL SURGERY

## 2023-11-09 DIAGNOSIS — E042 Nontoxic multinodular goiter: Secondary | ICD-10-CM | POA: Insufficient documentation

## 2023-11-12 ENCOUNTER — Ambulatory Visit (INDEPENDENT_AMBULATORY_CARE_PROVIDER_SITE_OTHER): Payer: Self-pay

## 2023-11-13 DIAGNOSIS — E041 Nontoxic single thyroid nodule: Secondary | ICD-10-CM

## 2023-11-13 LAB — CYTOPATHOLOGY, FINE NEEDLE ASPIRATE

## 2023-11-16 ENCOUNTER — Ambulatory Visit (INDEPENDENT_AMBULATORY_CARE_PROVIDER_SITE_OTHER): Payer: Self-pay

## 2023-11-30 ENCOUNTER — Encounter (HOSPITAL_BASED_OUTPATIENT_CLINIC_OR_DEPARTMENT_OTHER): Payer: Self-pay | Admitting: NURSE PRACTITIONER

## 2023-11-30 ENCOUNTER — Ambulatory Visit: Payer: Medicare Other | Attending: NURSE PRACTITIONER | Admitting: NURSE PRACTITIONER

## 2023-11-30 ENCOUNTER — Other Ambulatory Visit: Payer: Self-pay

## 2023-11-30 ENCOUNTER — Ambulatory Visit (HOSPITAL_BASED_OUTPATIENT_CLINIC_OR_DEPARTMENT_OTHER): Payer: Medicare Other | Admitting: GENERAL SURGERY

## 2023-11-30 VITALS — BP 157/88 | HR 97 | Temp 97.1°F | Resp 16 | Ht 67.0 in | Wt 341.6 lb

## 2023-11-30 VITALS — BP 150/90 | HR 90 | Temp 98.1°F | Resp 16 | Ht 67.0 in | Wt 339.0 lb

## 2023-11-30 DIAGNOSIS — R739 Hyperglycemia, unspecified: Secondary | ICD-10-CM | POA: Insufficient documentation

## 2023-11-30 DIAGNOSIS — Z8349 Family history of other endocrine, nutritional and metabolic diseases: Secondary | ICD-10-CM

## 2023-11-30 DIAGNOSIS — E66813 Obesity, class 3: Secondary | ICD-10-CM

## 2023-11-30 DIAGNOSIS — E039 Hypothyroidism, unspecified: Secondary | ICD-10-CM

## 2023-11-30 DIAGNOSIS — Z6841 Body Mass Index (BMI) 40.0 and over, adult: Secondary | ICD-10-CM | POA: Insufficient documentation

## 2023-11-30 DIAGNOSIS — E042 Nontoxic multinodular goiter: Secondary | ICD-10-CM | POA: Insufficient documentation

## 2023-11-30 DIAGNOSIS — Z79890 Hormone replacement therapy (postmenopausal): Secondary | ICD-10-CM

## 2023-11-30 DIAGNOSIS — E559 Vitamin D deficiency, unspecified: Secondary | ICD-10-CM | POA: Insufficient documentation

## 2023-11-30 MED ORDER — LEVOTHYROXINE 150 MCG TABLET
150.0000 ug | ORAL_TABLET | Freq: Every morning | ORAL | 1 refills | Status: AC
Start: 2023-11-30 — End: ?

## 2023-11-30 NOTE — Progress Notes (Signed)
ENDOCRINOLOGY, [facility]  [address]  [place] [address]  Operated by [facility]    Name: Meghan Garner MRN:  [id number]   Date: 11/30/2023 DOB:  [birth year] (37 y.o.)     PCP: Myra Gianotti Hill-Hurt, PA-C  Referring Provider: Mariah Milling     Chief Complaint: Hypothyroidism (Last office visit 08/17/23)    HPI    Prescribed Armour Thyroid 150 mg. Daily. She says she was receiving from company through PCP, Unable to get Armour Thyroid approved. She says she has been prescribed thyroid medication since she was 37 years old. She says she has been taking four 30 mg. Tablets of Of Armour Thyroid for the past month because she ran out of Armour Thyroid 150, was prescribed the 300 mg. Tablet and cutting in half. She says she has taken Synthroid, Levothyroxine, NP Thyroid in the past, prefers Armour Thyroid but is willing to change since unable to get Armour Thyroid from company. Thyroid scan and uptake ordered by her PCP 04/2022 showing activity in right thyroid lobe greater than the left, decreased activity right mid gland. Low radioiodine uptake at all time points, 4.5% at 24 hours. Patient states she thinks she stopped taking Armour thyroid prior to scan and uptake. TSH 16.800, free T4 0.4 from 05/2023. TSH 33.600, free T4 0.43 from 11/2023.     Thyroid ultrasound 07/2023 showing 1.6 cm. Thyroid nodule right side, this is her first ultrasound. Also, Nearly isoechoic 1.1 and 1.1 cm nodules are seen in the right lower thyroid gland. Similar appearing 1.0 cm solid nearly isoechoic nodule in the left lower gland. She denies family history of thyroid cancer and denies prolonged radiation exposure to her neck. She says she had FNA with Dr. Waymon Budge showing follicular lesion of undetermined significance. She says she has appointment with him today to schedule surgery. Pathology report showing      HgbA1C 6.1% from 07/2022. HgbA1C 5.7% from 05/2023. She says PCP started Metformin in the past  for hyperglycemia and she was allergic to medication. She says she broke out in hives from Metformin.      Taking Vitamin D 50,000 units weekly.     Past Medical History:  Past Medical History:   Diagnosis Date    Hypothyroid          Past Surgical History:   Procedure Laterality Date    HX LAP CHOLECYSTECTOMY      HX TONSILLECTOMY           Baclofen (LIORESAL) 20 mg Oral Tablet, Take 1 Tablet (20 mg total) by mouth Three times a day  buPROPion (WELLBUTRIN SR) 150 mg Oral tablet sustained-release 12 hr, Take 1 Tablet (150 mg total) by mouth Twice daily  ergocalciferol, vitamin D2, (DRISDOL) 1,250 mcg (50,000 unit) Oral Capsule, Take 1 Capsule (50,000 Units total) by mouth Every 7 days  OXcarbazepine (TRILEPTAL) 300 mg Oral Tablet, ON DAY 1-3 TAKE 1 CAPSULE BY MOUTH TWICE A DAY, ON DAY 4-6 TAKE 1 CAPSULE IN THE AM AND 2 CAPS IN THE EVENING, ON DAY 7-9 TAKE 2 CAPS TWICE A DAY, ON DAY 10-12 TAKE 2 CAPS IN THE AM AND 3 CAPS IN THE EVENING, ON DAY 13 AND BEYOND TAKE 3 CAPS TWICE A DAY  pantoprazole (PROTONIX) 40 mg Oral Tablet, Delayed Release (E.C.), Take 1 Tablet (40 mg total) by mouth Once a day  spironolactone (ALDACTONE) 50 mg Oral Tablet, Take 1 Tablet (50 mg total) by mouth Every morning with  breakfast  thyroid (ARMOUR THYROID) 300 mg Oral Tablet, Take 0.5 Tablets (150 mg total) by mouth Once a day    No facility-administered medications prior to visit.     Allergies   Allergen Reactions    Cephalosporins Anaphylaxis    Penicillins Anaphylaxis    Metformin Rash    Sulfa (Sulfonamides) Rash     Family Medical History:       Problem Relation (Age of Onset)    Thyroid Disease Paternal Grandmother    Thyroid nodules Paternal Grandmother            Social History     Socioeconomic History    Marital status: Married     Spouse name: Not on file    Number of children: Not on file    Years of education: Not on file    Highest education level: Not on file   Occupational History    Not on file   Tobacco Use    Smoking  status: Never    Smokeless tobacco: Never   Substance and Sexual Activity    Alcohol use: Never    Drug use: Never    Sexual activity: Not on file   Other Topics Concern    Not on file   Social History Narrative    Not on file     Social Determinants of Health     Financial Resource Strain: Not on file   Transportation Needs: Not on file   Social Connections: Not on file   Intimate Partner Violence: Not on file   Housing Stability: Not on file        There is no immunization history on file for this patient.    Review of Systems   Constitutional: Negative.    HENT: Negative.     Eyes: Negative.    Respiratory: Negative.     Cardiovascular: Negative.    Gastrointestinal: Negative.    Endocrine: Negative.    Genitourinary: Negative.    Musculoskeletal: Negative.    Skin: Negative.    Allergic/Immunologic: Negative.    Neurological: Negative.    Hematological: Negative.    Psychiatric/Behavioral: Negative.          Nursing Notes:  There are no exam notes on file for this visit.    Physical Exam  Constitutional:       Appearance: Normal appearance.   HENT:      Head: Normocephalic and atraumatic.   Eyes:      Conjunctiva/sclera: Conjunctivae normal.   Cardiovascular:      Rate and Rhythm: Normal rate and regular rhythm.      Pulses: Normal pulses.      Heart sounds: Normal heart sounds.   Pulmonary:      Effort: Pulmonary effort is normal.      Breath sounds: Normal breath sounds.   Musculoskeletal:         General: Normal range of motion.   Skin:     General: Skin is warm and dry.   Neurological:      General: No focal deficit present.      Mental Status: She is oriented to person, place, and time.   Psychiatric:         Mood and Affect: Mood normal.         Behavior: Behavior normal.          Vitals:   Vitals:    11/30/23 0935   BP: (!) 150/90   Pulse: 90   Resp:  16   Temp: 36.7 C (98.1 F)   TempSrc: Temporal   SpO2: 99%   Weight: (!) 154 kg (339 lb)   Height: 1.702 m (5\' 7" )   BMI: 53.09     Assessment and Plan:      Monitor BP at home. Follow up with PCP if BP remains elevated. Labwork before next appointment. Keep follow up appointment with Dr. Waymon Budge. Work on [employer] and exercise. Discontinue Armour Thyroid. Start Levothyroxine 150 mcg. Daily, prescription sent.       Encounter Diagnoses   Name Primary?    Multinodular goiter Yes    Hypothyroidism, unspecified type     Morbid obesity with BMI of 50.0-59.9, adult (CMS HCC)     Hyperglycemia     Vitamin D deficiency         Return in about 3 months (around 02/28/2024).      Wilmer Floor, FNP-C  11/30/2023, 09:54

## 2023-11-30 NOTE — Nursing Note (Signed)
Audriana Signor is a 37 year old female here today to discuss thyroid surgery. Patient was referred by Kathlyn Sacramento. Patient has a hx of thyroid nodules. Patient had a right FNS done at [facility] which showed increased Hurthle calls. The patient states that her PCP sent her for an ultrasound due to her being diagnosed with hypothyroidism. The patient is currently taking Armor thyroid 150. The patient admits to fatigue and weight gain. The patient denies heat or cold intolerance, choking sensations, bone pain, bone fractures, head or neck radiation. The patient admits to a family history of thyroid cancer in her paternal great grandmother. Thyroid ultrasound showed:      Right Lobe of thyroid measures: 1.42 cm x 1.40 cm x 4.84 cm    Right Lobe of thyroid is heterogenous    Right Lobe shows presence of an isoechoic solid nodule in the upper    portion measuring 1.67 x 1.12 x 1.02 cm    There is a hypoechoic solid nodule in the lower portion measuring 1.72 x    1.04 x 0.68 cm    There is a hypoechoic solid nodule in the mid portion posteriorly    measuring 1.0 x 0.76 x 0.61 cm        Left Lobe of thyroid measures: 1.58 cm x 1.42 cm x 4.46 cm    Left lobe of thyroid is heterogeneous    Left Lobe shows presence of multiple ill-defined nodules with the most    dominant being a hypoechoic nodule in the mid portion measuring 1.20 x    0.62 x 0.48 cm    There are several other ill-defined nodules.        Isthmus measures: 0.38 cm in thickness.

## 2023-12-01 ENCOUNTER — Encounter (HOSPITAL_BASED_OUTPATIENT_CLINIC_OR_DEPARTMENT_OTHER): Payer: Self-pay | Admitting: GENERAL SURGERY

## 2023-12-01 NOTE — H&P (Signed)
[facility]  [address]  [place] [address]  Operated by [facility]     Name: CARRINA ZINMAN MRN:  [id number]   Date of Birth: [birth year] Age: 37 y.o.   Date: 11/30/2023       Provider: Nicoletta Dress, MD    PCP: Myra Gianotti Hill-Hurt, PA-C    Referring Provider: Myra Gianotti Hill-Hurt     Reason for visit: Discussion Of Issues (Discuss thyroid surgery. )  Multinodular goiter.  FNA showed atypia.  Hypothyroidism.    HPI:  Nursing Notes:   Carolynn Comment, LPN  25/42/70 [id number]  Signed  Meghan Garner is a 37 year old female here today to discuss thyroid surgery. Patient was referred by Kathlyn Sacramento. Patient has a hx of thyroid nodules. Patient had a right FNS done at [facility] which showed increased Hurthle calls. The patient states that her PCP sent her for an ultrasound due to her being diagnosed with hypothyroidism. The patient is currently taking Armor thyroid 150. The patient admits to fatigue and weight gain. The patient denies heat or cold intolerance, choking sensations, bone pain, bone fractures, head or neck radiation. The patient admits to a family history of thyroid cancer in her paternal great grandmother. Thyroid ultrasound showed:      Right Lobe of thyroid measures: 1.42 cm x 1.40 cm x 4.84 cm    Right Lobe of thyroid is heterogenous    Right Lobe shows presence of an isoechoic solid nodule in the upper    portion measuring 1.67 x 1.12 x 1.02 cm    There is a hypoechoic solid nodule in the lower portion measuring 1.72 x    1.04 x 0.68 cm    There is a hypoechoic solid nodule in the mid portion posteriorly    measuring 1.0 x 0.76 x 0.61 cm        Left Lobe of thyroid measures: 1.58 cm x 1.42 cm x 4.46 cm    Left lobe of thyroid is heterogeneous    Left Lobe shows presence of multiple ill-defined nodules with the most    dominant being a hypoechoic nodule in the mid portion measuring 1.20 x    0.62 x 0.48 cm    There are several other ill-defined  nodules.        Isthmus measures: 0.38 cm in thickness.            Past Medical History:  Past Medical History:   Diagnosis Date    Hypothyroid      Past Surgical History:   Procedure Laterality Date    HX LAP CHOLECYSTECTOMY      HX TONSILLECTOMY        Allergies   Allergen Reactions    Cephalosporins Anaphylaxis    Penicillins Anaphylaxis    Metformin Rash    Sulfa (Sulfonamides) Rash     Current Outpatient Medications   Medication Sig    Baclofen (LIORESAL) 20 mg Oral Tablet Take 1 Tablet (20 mg total) by mouth Three times a day    buPROPion (WELLBUTRIN SR) 150 mg Oral tablet sustained-release 12 hr Take 1 Tablet (150 mg total) by mouth Twice daily    ergocalciferol, vitamin D2, (DRISDOL) 1,250 mcg (50,000 unit) Oral Capsule Take 1 Capsule (50,000 Units total) by mouth Every 7 days    levothyroxine (SYNTHROID) 150 mcg Oral Tablet Take 1 Tablet (150 mcg total) by mouth Every morning    OXcarbazepine (TRILEPTAL) 300 mg Oral Tablet ON DAY 1-3  TAKE 1 CAPSULE BY MOUTH TWICE A DAY, ON DAY 4-6 TAKE 1 CAPSULE IN THE AM AND 2 CAPS IN THE EVENING, ON DAY 7-9 TAKE 2 CAPS TWICE A DAY, ON DAY 10-12 TAKE 2 CAPS IN THE AM AND 3 CAPS IN THE EVENING, ON DAY 13 AND BEYOND TAKE 3 CAPS TWICE A DAY    pantoprazole (PROTONIX) 40 mg Oral Tablet, Delayed Release (E.C.) Take 1 Tablet (40 mg total) by mouth Once a day    spironolactone (ALDACTONE) 50 mg Oral Tablet Take 1 Tablet (50 mg total) by mouth Every morning with breakfast    thyroid (ARMOUR THYROID) 300 mg Oral Tablet Take 0.5 Tablets (150 mg total) by mouth Once a day     Family History       Problem Relation Age of Onset Comments    Thyroid Disease Paternal Grandmother      Thyroid nodules Paternal Grandmother             Social History     Socioeconomic History    Marital status: Married   Tobacco Use    Smoking status: Never    Smokeless tobacco: Never   Substance and Sexual Activity    Alcohol use: Never    Drug use: Never       Review of System  The pertinent ROS as  addressed in the HPI.    Vital Signs:  Temperature: 36.2 C (97.1 F)  Heart Rate: 97  BP (Non-Invasive): (!) 157/88  Respiratory Rate: 16  SpO2: 99 %      Physical Exam:  Physical Exam      GENERAL  Constitutional: no acute distress, average body habitus and cooperative.  Speech clear and appropriate.  HEENT  Eye: Present EOMI, PERRL, normal accomodation and conjunctiva pink; Absent scleral icterus.  NECK  Neck: Present full ROM and norma inspection; Absent lymphedema or tenderness.  THYROID  No thyroid gland enlargement and there is not palpable thyroid nodules   LUNGS  Respiratory: Clear to auscultation bilaterally. No wheezes or rhonchi. No accessory muscle use noted.  CV  Cardiovascular exam: Present RRR, S1 and S2; absent murmur, gallop, or rubs.  ABD  Abdominal: Abdomen soft, nontender. Good bowel sounds x 4. No organomegaly appreciated.  EXT  Extremities: Present full ROM, normal capillary refiill and normal inspection; Absent clubbing or pedal edema  NEUROLOGICAL: Cranial nerves grossly intact.   NEURO  Neurological:Present alert, oriented X 3 CN II-XII intact and norma lreflexes, moving all extremities, normal tone and normal speech  PSYC  Psychiatric: Present normal affect, normal thought process, cooperative, good insight and good judgement, Absent depressed, anxious or agitated.     Assessment:  ENCOUNTER DIAGNOSES     ICD-10-CM   1. Multiple thyroid nodules  E04.2   2. Multinodular goiter (nontoxic)  E04.2         Clinic Ultrasound:  Recent Results (from the past 8765 hour(s))   CLINIC POCUS THYROID    Collection Time: 10/12/23 12:06 PM    Narrative    CLINIC POCUS THYROID performed on VONNA BOYCE on Oct 12, 2023 12:06   PM.  Thyroid Ultrasound  Technique: Thyroid ultrasound was performed using the Sonosite machine   Grey scale with the linear transducer and color doppler.    Indication: Thyroid nodule    Right Lobe of thyroid measures: 1.42 cm x 1.40 cm x 4.84 cm  Right Lobe of thyroid is  heterogenous  Right Lobe shows presence of an isoechoic solid  nodule in the upper   portion measuring 1.67 x 1.12 x 1.02 cm  There is a hypoechoic solid nodule in the lower portion measuring 1.72 x   1.04 x 0.68 cm  There is a hypoechoic solid nodule in the mid portion posteriorly   measuring 1.0 x 0.76 x 0.61 cm      Left Lobe of  thyroid measures: 1.58 cm x 1.42 cm x 4.46 cm  Left lobe of thyroid is heterogeneous  Left Lobe shows presence of multiple ill defined nodules with the most   dominant being a hypoechoic nodule in the mid portion measuring 1.20 x   0.62 x 0.48 cm  There are several other ill defined nodules.     Isthmus measures: 0.38 cm in thickness.    Fine needle aspiration of the thyroid lesion no              Plan:  She was evaluated previously regarding thyroid nodules.  She has had history of hypothyroidism and treated with Armour thyroid for several years.  She had ultrasound of the thyroid gland and FNA of thyroid nodule on the right lobe of the thyroid done at the hospital.  The pathology report reported follicular lesion atypia of undetermined significance.  We performed examination and ultrasound of the thyroid gland on 10/12/2023.  She was found to have significant heterogenous thyroid tissue with multiple nodules.  The nodules were multiple hypoechoic and FNA was not repeated.  We have recommended thyroidectomy.  New line today she returned after she had discussed her options of management with her physicians and the plans are to proceed with total thyroidectomy.   The operation and the risk of surgery was discussed at length.  We discussed the findings on physical examination and on imaging ultrasound. We discussed the pathology report of the fine needle aspiration and discussed options of management.  The decision was made to proceed with thyroidectomy. The operation and the risk of surgery was discussed at length with the patient. The patient was advised they would require thyroid  replacement in the form of tablets taken daily for life and monitored by repeat blood work. The risk of hoarseness due to injury to the recurrent laryngeal nerve, which may be permanent in up to 5% of patients, was also discussed with the patient. We discussed the risk of injury to the parathyroid glands which may result in low blood calcium and the need for taking calcium supplement.     The patient/family understood the risk and is willing to proceed with surgery.       Nicoletta Dress, MD

## 2023-12-01 NOTE — Patient Instructions (Signed)
Schedule total thyroidectomy.

## 2023-12-03 ENCOUNTER — Other Ambulatory Visit (HOSPITAL_BASED_OUTPATIENT_CLINIC_OR_DEPARTMENT_OTHER): Payer: Self-pay | Admitting: GENERAL SURGERY

## 2023-12-03 DIAGNOSIS — E876 Hypokalemia: Secondary | ICD-10-CM

## 2023-12-27 ENCOUNTER — Other Ambulatory Visit (HOSPITAL_COMMUNITY): Payer: Self-pay | Admitting: GENERAL SURGERY

## 2023-12-27 ENCOUNTER — Encounter (HOSPITAL_COMMUNITY): Payer: Self-pay | Admitting: GENERAL SURGERY

## 2023-12-27 DIAGNOSIS — Z01818 Encounter for other preprocedural examination: Secondary | ICD-10-CM

## 2023-12-27 NOTE — OR PreOp (Signed)
12-27-23 Patient denies chest pains and denies cardiologist. Ask if she could have pat at [place], since she lives in [place]. Informed her to call Dr. Jiles Prows office and  then let me know

## 2023-12-27 NOTE — OR PreOp (Signed)
Tennie Rabold Ahonen   Pre-Admission Testing Nurse: [phone]  Surgery Date: JAN. 13-25  Arrive at: [facility]  Procedure: TOTAL THYROIDECTOMY: [phone] (CPT)    Bring a complete and accurate list of your medications (including dosages) AND past medical and surgical history. This helps to ensure you received the safest care possible.   If you are taking any over-the-counter substances, such as herbal supplements, diet pills, pain relievers or other non prescription medication, please notify the Pre-Admission Testing Nurse, your doctor and any other health team member interviewing you or performing any assessment. DO NOT take any over-the-counter medications for 8 days prior to your procedure, unless otherwise instructed by your physician. Failure to disclose this information could be life threatening.   DO NOT eat or drink anything after midnight prior to surgery including: NO GUM, NO MINTS, NO TOBACCO OR TOBACCO PRODUCTS OF ANY KIND - 24 HOURS PRIOR TO SURGERY. (including but not limited to: cigarettes, cigars, chewing tobacco, snuff, pipe, vaping etc.), Approved medications may be taken with a sip of water the morning of your surgery. You may brush your teeth the morning of your surgery.   Do not wear make-up, lotions, or colored fingernail polish the day of surgery.   Leave all jewelry (including piercing) at home due to the risk of injury. No jewelry will be permitted in the Operating Room. ALL piercings must be removed, metal, plastic, silicone, etc.   If you have any prosthetics, please inform your pre-admission testing nurse.   Wear loose-fitting, comfortable clothing.   If you require reading glasses or hearing aids, please bring them with you the day of surgery. Your dentures can remain in place until immediately before your procedure, but do not use any denture adhesive. They will be returned once you are awake and alert in the Recovery Room.   If you require interpretive services,  our facility will provide these for you. Please ask if you have any questions regarding this service.   If required for your procedure, you will be given a Hibiclens Soap Kit with instructions for use at home. The kit is to be used the night before AND the morning of surgery following the instructions given.   When you arrive at the area specified, the nurse will prepare you for your procedure and complete any additional blood work/paperwork. An IV will be started as needed.   One support person/family member will be able to join you in the pre-operative area once the per-op nurse has prepared you for your procedure. Children may have both parents with them.   When you are taken to the Operating Room, your family will be shown to the waiting room. Your family will be provided with a case number so they can track the progress of your surgical case while in the waiting area. When your procedure has been completed, your surgeon will speak to your family.   After your procedure, you will be taken to the Recovery Room until you are awake and alert (30 minutes - 2 hours). You will then be transported to the appropriate area for further care.   You can expect to experience some discomfort and possible nausea. Discuss this with your nurse so that medications can be given, if appropriate.   Some procedures may require you to assume a special position in the recovery period. We will explain this if it applies to you.   It may also be necessary for you to have tubes after surgery (catheter, drains,  from incision).   After surgery, you will need to breathe deeply and to cough (helping to prevent a form of pneumonia that can occur after having general anesthesia).   Medications to be taken the morning of surgery with a sip of water: SYNTHROID, WELLBUTRIN AND MAY USE ALBUTEROL  INHALERS  INSTRUCTED PATIENT TO USE HIBICLENS SOAP THE NIGHT BEFORE SURGERY AND THE MORNING OF SURGERY-NECK DOWN   Last dose of Metformin 48  hours prior to surgery: DENIES   Additional comments: NO TOBACCO PRODUCTS 24 HOURS BEFORE SURGERY AND NO VAPING   If you use CPAP/BiPAP, bring your machine with you on day of surgery if you will be staying overnight.  For those patients having same-day surgery:   Arrange for transportation home with a responsible adult who will remain with you for 24 hours after your procedure. The adult must be present with you prior to the procedure. You will not be allowed to proceed with the planned surgery if you are not accompanied by an appropriate post-procedure caregiver.   When you are able to tolerate fluids, urinate, etc, you will receive discharge instructions for your care at home (both verbal and written).   After completing all of the above, you will be discharged to home with instructions to follow-up   If you have any additional questions, please contact your surgeon's office or the Pre-Admission Testing Nurse at [phone].   Please follow any specific instructions from you surgeon's office concerning aspirin/ other blood thinners, physical therapy, or bowel preps/enemas, etc.    SIGNATURE: ___________________________________________________.    DATE:  ___________________________________________________.

## 2024-01-04 ENCOUNTER — Other Ambulatory Visit: Payer: Self-pay

## 2024-01-04 ENCOUNTER — Ambulatory Visit
Admission: RE | Admit: 2024-01-04 | Discharge: 2024-01-04 | Disposition: A | Discharge status: Home / Self Care | Payer: Medicare PPO | Source: Ambulatory Visit | Attending: GENERAL SURGERY | Admitting: GENERAL SURGERY

## 2024-01-04 ENCOUNTER — Ambulatory Visit (HOSPITAL_COMMUNITY): Payer: Medicare PPO

## 2024-01-04 DIAGNOSIS — E876 Hypokalemia: Secondary | ICD-10-CM | POA: Insufficient documentation

## 2024-01-04 DIAGNOSIS — Z01818 Encounter for other preprocedural examination: Secondary | ICD-10-CM

## 2024-01-04 LAB — CBC WITH DIFF
BASOPHIL #: 0.1 10*3/uL (ref 0.00–0.10)
BASOPHIL %: 1 % (ref 0–1)
EOSINOPHIL #: 0.2 10*3/uL (ref 0.00–0.50)
EOSINOPHIL %: 1 % (ref 1–7)
HCT: 41.8 % (ref 31.2–41.9)
HGB: 13.8 g/dL (ref 10.9–14.3)
LYMPHOCYTE #: 2.6 10*3/uL (ref 1.00–3.00)
LYMPHOCYTE %: 22 % (ref 16–44)
MCH: 28.4 pg (ref 24.7–32.8)
MCHC: 33.1 g/dL (ref 32.3–35.6)
MCV: 85.9 fL (ref 75.5–95.3)
MONOCYTE #: 0.7 10*3/uL (ref 0.30–1.00)
MONOCYTE %: 6 % (ref 5–13)
MPV: 6.9 fL — ABNORMAL LOW (ref 7.9–10.8)
NEUTROPHIL #: 8.2 10*3/uL — ABNORMAL HIGH (ref 1.85–7.80)
NEUTROPHIL %: 70 % (ref 43–77)
PLATELETS: 457 10*3/uL — ABNORMAL HIGH (ref 140–440)
RBC: 4.86 10*6/uL (ref 3.63–4.92)
RDW: 14.2 % (ref 12.3–17.7)
WBC: 11.7 10*3/uL (ref 3.8–11.8)

## 2024-01-04 LAB — BASIC METABOLIC PANEL
ANION GAP: 9 mmol/L (ref 4–13)
BUN/CREA RATIO: 18 (ref 6–22)
BUN: 18 mg/dL (ref 7–25)
CALCIUM: 9.3 mg/dL (ref 8.6–10.3)
CHLORIDE: 106 mmol/L (ref 98–107)
CO2 TOTAL: 23 mmol/L (ref 21–31)
CREATININE: 1 mg/dL (ref 0.60–1.30)
ESTIMATED GFR: 75 mL/min/{1.73_m2} (ref >59)
GLUCOSE: 119 mg/dL — ABNORMAL HIGH (ref 74–109)
OSMOLALITY, CALCULATED: 279 mosm/kg (ref 270–290)
POTASSIUM: 4.1 mmol/L (ref 3.5–5.1)
SODIUM: 138 mmol/L (ref 136–145)

## 2024-01-07 ENCOUNTER — Observation Stay (HOSPITAL_COMMUNITY): Payer: Medicare PPO | Admitting: Anesthesiology

## 2024-01-07 ENCOUNTER — Ambulatory Visit (HOSPITAL_COMMUNITY)
Admission: RE | Disposition: A | Discharge status: Home / Self Care | Payer: Medicare PPO | Source: Ambulatory Visit | Attending: GENERAL SURGERY

## 2024-01-07 ENCOUNTER — Other Ambulatory Visit: Payer: Self-pay

## 2024-01-07 ENCOUNTER — Encounter (HOSPITAL_COMMUNITY): Payer: Self-pay | Admitting: GENERAL SURGERY

## 2024-01-07 ENCOUNTER — Observation Stay
Admission: RE | Admit: 2024-01-07 | Discharge: 2024-01-08 | Disposition: A | Discharge status: Home / Self Care | Payer: Medicare PPO | Source: Ambulatory Visit | Attending: GENERAL SURGERY | Admitting: GENERAL SURGERY

## 2024-01-07 ENCOUNTER — Observation Stay (HOSPITAL_COMMUNITY): Payer: Medicare PPO | Admitting: GENERAL SURGERY

## 2024-01-07 DIAGNOSIS — Z6841 Body Mass Index (BMI) 40.0 and over, adult: Secondary | ICD-10-CM | POA: Insufficient documentation

## 2024-01-07 DIAGNOSIS — R000 Tachycardia, unspecified: Secondary | ICD-10-CM

## 2024-01-07 DIAGNOSIS — J45909 Unspecified asthma, uncomplicated: Secondary | ICD-10-CM | POA: Insufficient documentation

## 2024-01-07 DIAGNOSIS — F419 Anxiety disorder, unspecified: Secondary | ICD-10-CM | POA: Insufficient documentation

## 2024-01-07 DIAGNOSIS — R9431 Abnormal electrocardiogram [ECG] [EKG]: Secondary | ICD-10-CM

## 2024-01-07 DIAGNOSIS — G4730 Sleep apnea, unspecified: Secondary | ICD-10-CM | POA: Insufficient documentation

## 2024-01-07 DIAGNOSIS — Z79899 Other long term (current) drug therapy: Secondary | ICD-10-CM | POA: Insufficient documentation

## 2024-01-07 DIAGNOSIS — E119 Type 2 diabetes mellitus without complications: Secondary | ICD-10-CM | POA: Insufficient documentation

## 2024-01-07 DIAGNOSIS — F32A Depression, unspecified: Secondary | ICD-10-CM | POA: Insufficient documentation

## 2024-01-07 DIAGNOSIS — G4720 Circadian rhythm sleep disorder, unspecified type: Secondary | ICD-10-CM | POA: Insufficient documentation

## 2024-01-07 DIAGNOSIS — E042 Nontoxic multinodular goiter: Principal | ICD-10-CM | POA: Insufficient documentation

## 2024-01-07 DIAGNOSIS — I10 Essential (primary) hypertension: Secondary | ICD-10-CM | POA: Insufficient documentation

## 2024-01-07 DIAGNOSIS — K219 Gastro-esophageal reflux disease without esophagitis: Secondary | ICD-10-CM | POA: Insufficient documentation

## 2024-01-07 DIAGNOSIS — E039 Hypothyroidism, unspecified: Secondary | ICD-10-CM | POA: Insufficient documentation

## 2024-01-07 HISTORY — DX: Gastro-esophageal reflux disease without esophagitis: K21.9

## 2024-01-07 HISTORY — DX: Trigeminal neuralgia: G50.0

## 2024-01-07 HISTORY — DX: Sleep apnea, unspecified: G47.30

## 2024-01-07 HISTORY — DX: Malignant (primary) neoplasm, unspecified (CMS HCC): C80.1

## 2024-01-07 HISTORY — DX: Other chronic pain: G89.29

## 2024-01-07 HISTORY — DX: Disorder of thyroid, unspecified: E07.9

## 2024-01-07 HISTORY — DX: Unspecified asthma, uncomplicated: J45.909

## 2024-01-07 HISTORY — DX: Type 2 diabetes mellitus without complications (CMS HCC): E11.9

## 2024-01-07 LAB — ECG 12 LEAD
Atrial Rate: 106 {beats}/min
Calculated P Axis: 54 degrees
Calculated R Axis: 55 degrees
Calculated T Axis: 50 degrees
PR Interval: 142 ms
QRS Duration: 90 ms
QT Interval: 350 ms
QTC Calculation: 464 ms
Ventricular rate: 106 {beats}/min

## 2024-01-07 LAB — IONIZED CALCIUM WITH PH
IONIZED CALCIUM: 0.89 mmol/L — ABNORMAL LOW (ref 1.15–1.33)
PH (VENOUS): 7.45 — ABNORMAL HIGH (ref 7.32–7.43)

## 2024-01-07 LAB — POTASSIUM: POTASSIUM: 5 mmol/L (ref 3.2–5.0)

## 2024-01-07 LAB — MAGNESIUM: MAGNESIUM: 2 mg/dL (ref 1.6–2.6)

## 2024-01-07 SURGERY — THYROIDECTOMY
Anesthesia: General | Laterality: Bilateral | Wound class: Clean Wound: Uninfected operative wounds in which no inflammation occurred

## 2024-01-07 MED ORDER — HYDROMORPHONE 0.5 MG/0.5 ML INJECTION SYRINGE
INJECTION | INTRAMUSCULAR | Status: AC
Start: 2024-01-07 — End: 2024-01-07
  Filled 2024-01-07: qty 0.5

## 2024-01-07 MED ORDER — OXCARBAZEPINE 300 MG TABLET
300.0000 mg | ORAL_TABLET | Freq: Two times a day (BID) | ORAL | Status: DC
Start: 2024-01-07 — End: 2024-01-08
  Administered 2024-01-07 – 2024-01-08 (×3): 300 mg via ORAL
  Filled 2024-01-07 (×5): qty 1

## 2024-01-07 MED ORDER — SODIUM CHLORIDE 0.9 % (FLUSH) INJECTION SYRINGE
3.0000 mL | INJECTION | INTRAMUSCULAR | Status: DC | PRN
Start: 2024-01-07 — End: 2024-01-07

## 2024-01-07 MED ORDER — MIDAZOLAM 1 MG/ML INJECTION WRAPPER
INTRAMUSCULAR | Status: AC
Start: 2024-01-07 — End: 2024-01-07
  Filled 2024-01-07: qty 2

## 2024-01-07 MED ORDER — BUPIVACAINE-EPINEPHRINE 0.5 %-1:200,000 INJECTION SOLUTION
INTRAMUSCULAR | Status: AC
Start: 2024-01-07 — End: 2024-01-07
  Filled 2024-01-07: qty 50

## 2024-01-07 MED ORDER — MIDAZOLAM 1 MG/ML INJECTION WRAPPER
2.0000 mg | Freq: Once | INTRAMUSCULAR | Status: AC
Start: 2024-01-07 — End: 2024-01-07
  Administered 2024-01-07: 2 mg via INTRAVENOUS

## 2024-01-07 MED ORDER — LEVOFLOXACIN 500 MG/100 ML IN 5 % DEXTROSE INTRAVENOUS PIGGYBACK
500.0000 mg | INJECTION | Freq: Once | INTRAVENOUS | Status: AC
Start: 2024-01-07 — End: 2024-01-07
  Administered 2024-01-07: 500 mg via INTRAVENOUS

## 2024-01-07 MED ORDER — ONDANSETRON HCL (PF) 4 MG/2 ML INJECTION SOLUTION
4.0000 mg | Freq: Once | INTRAMUSCULAR | Status: DC | PRN
Start: 2024-01-07 — End: 2024-01-07

## 2024-01-07 MED ORDER — DEXAMETHASONE SODIUM PHOSPHATE 4 MG/ML INJECTION SOLUTION
Freq: Once | INTRAMUSCULAR | Status: DC | PRN
Start: 2024-01-07 — End: 2024-01-07
  Administered 2024-01-07: 10 mg via INTRAVENOUS

## 2024-01-07 MED ORDER — SODIUM CHLORIDE 0.9 % (FLUSH) INJECTION SYRINGE
3.0000 mL | INJECTION | Freq: Three times a day (TID) | INTRAMUSCULAR | Status: DC
Start: 2024-01-07 — End: 2024-01-08
  Administered 2024-01-07: 0 mL

## 2024-01-07 MED ORDER — MAGNESIUM OXIDE 400 MG (241.3 MG MAGNESIUM) TABLET
800.0000 mg | ORAL_TABLET | Freq: Three times a day (TID) | ORAL | Status: AC
Start: 2024-01-07 — End: 2024-01-08
  Administered 2024-01-07 – 2024-01-08 (×2): 800 mg via ORAL
  Filled 2024-01-07 (×4): qty 2

## 2024-01-07 MED ORDER — FENTANYL (PF) 50 MCG/ML INJECTION WRAPPER
50.0000 ug | INJECTION | Freq: Once | INTRAMUSCULAR | Status: DC
Start: 2024-01-07 — End: 2024-01-07

## 2024-01-07 MED ORDER — CALCIUM 200 MG (AS CALCIUM CARBONATE 500 MG) CHEWABLE TABLET
1000.0000 mg | CHEWABLE_TABLET | ORAL | Status: AC
Start: 2024-01-07 — End: 2024-01-07
  Administered 2024-01-07: 1000 mg via ORAL
  Filled 2024-01-07: qty 2

## 2024-01-07 MED ORDER — FENTANYL (PF) 50 MCG/ML INJECTION WRAPPER
INJECTION | Freq: Once | INTRAMUSCULAR | Status: DC | PRN
Start: 2024-01-07 — End: 2024-01-07
  Administered 2024-01-07 (×2): 50 ug via INTRAVENOUS
  Administered 2024-01-07: 100 ug via INTRAVENOUS

## 2024-01-07 MED ORDER — ONDANSETRON HCL (PF) 4 MG/2 ML INJECTION SOLUTION
4.0000 mg | Freq: Four times a day (QID) | INTRAMUSCULAR | Status: DC | PRN
Start: 2024-01-07 — End: 2024-01-08
  Administered 2024-01-07 – 2024-01-08 (×2): 4 mg via INTRAVENOUS
  Filled 2024-01-07 (×2): qty 2

## 2024-01-07 MED ORDER — ALBUTEROL SULFATE 2.5 MG/3 ML (0.083 %) SOLUTION FOR NEBULIZATION
2.5000 mg | INHALATION_SOLUTION | Freq: Once | RESPIRATORY_TRACT | Status: DC | PRN
Start: 2024-01-07 — End: 2024-01-07

## 2024-01-07 MED ORDER — FENTANYL (PF) 50 MCG/ML INJECTION SOLUTION
INTRAMUSCULAR | Status: AC
Start: 2024-01-07 — End: 2024-01-07
  Filled 2024-01-07: qty 6

## 2024-01-07 MED ORDER — BUPROPION HCL SR 150 MG TABLET,12 HR SUSTAINED-RELEASE
150.0000 mg | ORAL_TABLET | Freq: Two times a day (BID) | ORAL | Status: DC
Start: 2024-01-07 — End: 2024-01-08
  Administered 2024-01-07: 150 mg via ORAL
  Administered 2024-01-07: 0 mg via ORAL
  Administered 2024-01-08: 150 mg via ORAL
  Filled 2024-01-07 (×4): qty 1

## 2024-01-07 MED ORDER — HYDROCODONE 5 MG-ACETAMINOPHEN 325 MG TABLET
1.0000 | ORAL_TABLET | ORAL | Status: DC | PRN
Start: 2024-01-07 — End: 2024-01-08
  Administered 2024-01-08: 1 via ORAL
  Filled 2024-01-07: qty 1

## 2024-01-07 MED ORDER — BACLOFEN 20 MG TABLET
20.0000 mg | ORAL_TABLET | Freq: Three times a day (TID) | ORAL | Status: DC
Start: 2024-01-07 — End: 2024-01-08
  Administered 2024-01-07 – 2024-01-08 (×3): 20 mg via ORAL
  Filled 2024-01-07 (×6): qty 1

## 2024-01-07 MED ORDER — CALCIUM 200 MG (AS CALCIUM CARBONATE 500 MG) CHEWABLE TABLET
1000.0000 mg | CHEWABLE_TABLET | Freq: Three times a day (TID) | ORAL | Status: AC
Start: 2024-01-07 — End: 2024-01-08
  Administered 2024-01-07 – 2024-01-08 (×2): 1000 mg via ORAL
  Filled 2024-01-07 (×3): qty 2

## 2024-01-07 MED ORDER — HYDROMORPHONE 0.5 MG/0.5 ML INJECTION SYRINGE
0.2500 mg | INJECTION | INTRAMUSCULAR | Status: DC | PRN
Start: 2024-01-07 — End: 2024-01-07

## 2024-01-07 MED ORDER — LACTATED RINGERS INTRAVENOUS SOLUTION
INTRAVENOUS | Status: DC
Start: 2024-01-07 — End: 2024-01-07

## 2024-01-07 MED ORDER — BUPIVACAINE-EPINEPHRINE 0.25 %-1:200,000 INJECTION SOLUTION
Freq: Once | INTRAMUSCULAR | Status: DC | PRN
Start: 2024-01-07 — End: 2024-01-07
  Administered 2024-01-07: 9 mL via INTRAMUSCULAR

## 2024-01-07 MED ORDER — HYDROCODONE 10 MG-ACETAMINOPHEN 325 MG TABLET
1.0000 | ORAL_TABLET | ORAL | Status: DC | PRN
Start: 2024-01-07 — End: 2024-01-08
  Administered 2024-01-07 – 2024-01-08 (×3): 1 via ORAL
  Filled 2024-01-07 (×3): qty 1

## 2024-01-07 MED ORDER — LACTATED RINGERS INTRAVENOUS SOLUTION
INTRAVENOUS | Status: DC
Start: 2024-01-07 — End: 2024-01-08

## 2024-01-07 MED ORDER — CLINDAMYCIN 600 MG/50 ML IN 5 % DEXTROSE INTRAVENOUS PIGGYBACK
INJECTION | INTRAVENOUS | Status: AC
Start: 2024-01-07 — End: 2024-01-07
  Filled 2024-01-07: qty 50

## 2024-01-07 MED ORDER — FAMOTIDINE (PF) 20 MG/2 ML INTRAVENOUS SOLUTION
Freq: Once | INTRAVENOUS | Status: DC | PRN
Start: 2024-01-07 — End: 2024-01-07
  Administered 2024-01-07: 20 mg via INTRAVENOUS

## 2024-01-07 MED ORDER — LABETALOL 20 MG/4 ML (5 MG/ML) INTRAVENOUS SYRINGE
10.0000 mg | INJECTION | INTRAVENOUS | Status: DC | PRN
Start: 2024-01-07 — End: 2024-01-07

## 2024-01-07 MED ORDER — SUGAMMADEX 100 MG/ML INTRAVENOUS SOLUTION
Freq: Once | INTRAVENOUS | Status: DC | PRN
Start: 2024-01-07 — End: 2024-01-07
  Administered 2024-01-07: 400 mg via INTRAVENOUS

## 2024-01-07 MED ORDER — FAMOTIDINE 20 MG TABLET
40.0000 mg | ORAL_TABLET | Freq: Every day | ORAL | Status: DC
Start: 2024-01-07 — End: 2024-01-08
  Administered 2024-01-07: 0 mg via ORAL
  Administered 2024-01-08: 40 mg via ORAL
  Filled 2024-01-07: qty 2

## 2024-01-07 MED ORDER — SUGAMMADEX 100 MG/ML INTRAVENOUS SOLUTION
INTRAVENOUS | Status: AC
Start: 2024-01-07 — End: 2024-01-07
  Filled 2024-01-07: qty 4

## 2024-01-07 MED ORDER — ERGOCALCIFEROL (VITAMIN D2) 1,250 MCG (50,000 UNIT) CAPSULE
50000.0000 [IU] | ORAL_CAPSULE | ORAL | Status: DC
Start: 2024-01-07 — End: 2024-01-08
  Administered 2024-01-07: 0 [IU] via ORAL
  Administered 2024-01-08: 50000 [IU] via ORAL
  Filled 2024-01-07: qty 1

## 2024-01-07 MED ORDER — IPRATROPIUM 0.5 MG-ALBUTEROL 3 MG (2.5 MG BASE)/3 ML NEBULIZATION SOLN
3.0000 mL | INHALATION_SOLUTION | Freq: Once | RESPIRATORY_TRACT | Status: DC | PRN
Start: 2024-01-07 — End: 2024-01-07

## 2024-01-07 MED ORDER — SODIUM CHLORIDE 0.9 % (FLUSH) INJECTION SYRINGE
3.0000 mL | INJECTION | Freq: Three times a day (TID) | INTRAMUSCULAR | Status: DC
Start: 2024-01-07 — End: 2024-01-07

## 2024-01-07 MED ORDER — LIDOCAINE (PF) 100 MG/5 ML (2 %) INTRAVENOUS SYRINGE
INJECTION | Freq: Once | INTRAVENOUS | Status: DC | PRN
Start: 2024-01-07 — End: 2024-01-07
  Administered 2024-01-07: 60 mg via INTRAVENOUS

## 2024-01-07 MED ORDER — HYDROMORPHONE 0.5 MG/0.5 ML INJECTION SYRINGE
0.5000 mg | INJECTION | INTRAMUSCULAR | Status: DC | PRN
Start: 2024-01-07 — End: 2024-01-07
  Administered 2024-01-07 (×2): 0.5 mg via INTRAVENOUS

## 2024-01-07 MED ORDER — PROPOFOL 10 MG/ML IV BOLUS
INJECTION | Freq: Once | INTRAVENOUS | Status: DC | PRN
Start: 2024-01-07 — End: 2024-01-07
  Administered 2024-01-07: 200 mg via INTRAVENOUS

## 2024-01-07 MED ORDER — CLINDAMYCIN 300 MG/50 ML IN 5 % DEXTROSE INTRAVENOUS PIGGYBACK
INJECTION | INTRAVENOUS | Status: AC
Start: 2024-01-07 — End: 2024-01-07
  Filled 2024-01-07: qty 50

## 2024-01-07 MED ORDER — FENTANYL (PF) 50 MCG/ML INJECTION WRAPPER
25.0000 ug | INJECTION | INTRAMUSCULAR | Status: DC | PRN
Start: 2024-01-07 — End: 2024-01-07

## 2024-01-07 MED ORDER — HYDROMORPHONE 1 MG/ML INJECTION WRAPPER
INJECTION | INTRAMUSCULAR | Status: AC
Start: 2024-01-07 — End: 2024-01-07
  Filled 2024-01-07: qty 2

## 2024-01-07 MED ORDER — LACTATED RINGERS INTRAVENOUS SOLUTION
INTRAVENOUS | Status: AC
Start: 2024-01-07 — End: 2024-01-08

## 2024-01-07 MED ORDER — SPIRONOLACTONE 50 MG TABLET
50.0000 mg | ORAL_TABLET | Freq: Two times a day (BID) | ORAL | Status: DC
Start: 2024-01-07 — End: 2024-01-08
  Administered 2024-01-07 – 2024-01-08 (×2): 50 mg via ORAL
  Filled 2024-01-07 (×4): qty 1

## 2024-01-07 MED ORDER — HYDRALAZINE 20 MG/ML INJECTION SOLUTION
5.0000 mg | Freq: Once | INTRAMUSCULAR | Status: DC
Start: 2024-01-07 — End: 2024-01-07

## 2024-01-07 MED ORDER — CHOLECALCIFEROL (VITAMIN D3) 25 MCG (1,000 UNIT) TABLET
1000.0000 [IU] | ORAL_TABLET | ORAL | Status: AC
Start: 2024-01-07 — End: 2024-01-07
  Administered 2024-01-07: 1000 [IU] via ORAL
  Filled 2024-01-07: qty 1

## 2024-01-07 MED ORDER — ROCURONIUM 10 MG/ML INTRAVENOUS SOLUTION
Freq: Once | INTRAVENOUS | Status: DC | PRN
Start: 2024-01-07 — End: 2024-01-07
  Administered 2024-01-07: 100 mg via INTRAVENOUS
  Administered 2024-01-07: 30 mg via INTRAVENOUS

## 2024-01-07 MED ORDER — SODIUM CHLORIDE 0.9 % (FLUSH) INJECTION SYRINGE
3.0000 mL | INJECTION | INTRAMUSCULAR | Status: DC | PRN
Start: 2024-01-07 — End: 2024-01-08

## 2024-01-07 MED ORDER — PANTOPRAZOLE 40 MG TABLET,DELAYED RELEASE
40.0000 mg | DELAYED_RELEASE_TABLET | Freq: Every evening | ORAL | Status: DC
Start: 2024-01-07 — End: 2024-01-08
  Administered 2024-01-07: 40 mg via ORAL
  Filled 2024-01-07: qty 1

## 2024-01-07 MED ORDER — ONDANSETRON HCL (PF) 4 MG/2 ML INJECTION SOLUTION
Freq: Once | INTRAMUSCULAR | Status: DC | PRN
Start: 2024-01-07 — End: 2024-01-07
  Administered 2024-01-07: 4 mg via INTRAVENOUS

## 2024-01-07 MED ORDER — CALCIUM 200 MG (AS CALCIUM CARBONATE 500 MG) CHEWABLE TABLET
1000.0000 mg | CHEWABLE_TABLET | Freq: Once | ORAL | Status: AC
Start: 2024-01-08 — End: 2024-01-08
  Administered 2024-01-08: 1000 mg via ORAL
  Filled 2024-01-07: qty 2

## 2024-01-07 MED ORDER — PROCHLORPERAZINE EDISYLATE 10 MG/2 ML (5 MG/ML) INJECTION SOLUTION
5.0000 mg | Freq: Once | INTRAMUSCULAR | Status: DC | PRN
Start: 2024-01-07 — End: 2024-01-07

## 2024-01-07 MED ORDER — LEVOTHYROXINE 150 MCG TABLET
150.0000 ug | ORAL_TABLET | Freq: Every morning | ORAL | Status: DC
Start: 2024-01-07 — End: 2024-01-08
  Administered 2024-01-07: 0 ug via ORAL
  Administered 2024-01-08: 150 ug via ORAL
  Filled 2024-01-07 (×3): qty 1

## 2024-01-07 SURGICAL SUPPLY — 23 items
ADH SKNCLS 2-OCTYL CYNCRLT DRMBND PRINEO LIQUID 22CM (SUTURE AIDS) ×1 IMPLANT
CLIP SM 2.6MM INTERNAL LIGACLIP X TI LGT OPN STRL DISP ENDOS LF  BLU (WOUND CARE SUPPLY) ×7 IMPLANT
CONV USE ITEM 337680 - DRAPE LAP TRNSV ABS REINF FENESTRATE 122X106IN 14IN 4IN CNVRT LF  STRL SURG TBRN 26X25.5IN (DRAPE/PACKS/SHEETS/OR TOWEL) ×1
DISCONTINUED USE 330075 - ADH SKNCLS PREMIERPRO EXOFIN PREC PEN STRL LF  DISP 1GM (MED SURG SUPPLIES) ×1 IMPLANT
DRAIN INCS 10FR JP SIL RADOPQ END PRFR TROCAR STRL LF  RND DISP (MED SURG SUPPLIES) ×1 IMPLANT
DRAPE LAP TRNSV ABS REINF FENESTRATE 122X106IN 14IN 4IN CNVRT LF  STRL SURG TBRN 26X25.5IN (DRAPE/PACKS/SHEETS/OR TOWEL) ×1 IMPLANT
DSCT 21CM LIGASURE EXACT 40D 20.6MM 19.8MM 4MM CURVE JAW LOW TEMP PROF SURG 21.6X2MM VLAB (SURGICAL CUTTING SUPPLIES) ×1 IMPLANT
ELECTRODE ESURG BLADE 2.5IN 3/32IN EDGE STRL 1.1IN DISP STD SHAFT HEX LOCK LF (SURGICAL CUTTING SUPPLIES) ×1 IMPLANT
HEMOSTAT ABS 4X2IN SRGCL FBRLR (WOUND CARE SUPPLY) ×1 IMPLANT
HEMOSTAT ABS POWDER SRGCL OXD REGENERATED CLU (WOUND CARE SUPPLY) ×1 IMPLANT
HOOK 12MM RETRACT BLUNT ELAS STAY LONESTAR DISP STRL LF (MED SURG SUPPLIES) ×1 IMPLANT
NEEDLE HYPO  23GA 1.5IN TW PRCSNGL SS POLYPROP REG BVL LL HUB DEHP-FR TRQS STRL LF  DISP (MED SURG SUPPLIES) ×1 IMPLANT
PACK SURG PROC GEN PLST N/M STRL LF DISP MIN (CUSTOM TRAYS & PACK) ×1 IMPLANT
PEN SURG MRKNG DVN SKIN DISP FN TIP FLXB RLR CAP LBL GNTN VIOL STRL LF (MED SURG SUPPLIES) ×1 IMPLANT
RESERVOIR DRAIN SIL JP BULB 100CC STRL LF  DISP (MED SURG SUPPLIES) ×1 IMPLANT
SPONGE GAUZE 2X2IN CURITY LISCO RYN CLU ABS LF  NONST DISP (WOUND CARE SUPPLY) ×1 IMPLANT
STRIP STERI 1/2 X 4 IN BLND_TN ADH WND TAN (WOUND CARE SUPPLY) ×1 IMPLANT
SUTURE 3-0 PS-1 ETHILON 18.0I_N BLK NYLON MONOF NYL N/ABSB (SUTURE/WOUND CLOSURE) ×1 IMPLANT
SUTURE 4-0 SH VICRYL+ 27IN UNDYED ANBCTRL BRD COAT ABS (SUTURE/WOUND CLOSURE) ×1 IMPLANT
SUTURE 5-0 TF MONOCRYL 27IN UNDYED MONOF ABS (SUTURE/WOUND CLOSURE) ×1 IMPLANT
SUTURE SILK 3-0 PERMAHAND 30IN BLK BRD TIE NONAB (SUTURE/WOUND CLOSURE) ×1 IMPLANT
SYRINGE BD 10ML LF  STRL ST MED DISP (MED SURG SUPPLIES) ×1 IMPLANT
TRAY SKIN SCRUB 8IN VNYL COTTON 6 WNG 6 SPONGE STICK 2 TIP APPL DRY STRL LF (MED SURG SUPPLIES) ×1 IMPLANT

## 2024-01-07 NOTE — H&P (Addendum)
[place] [facility]  History and Physical      Patient Name: Meghan Garner  Age: 38 y.o.  DOB: 12/29/1985  Date of Surgery: 01/07/2024   MRN: [id number]    Admitting Physician: Nicoletta Dress, MD [id number]*     Surgical Procedure: Total Thyroidectomy      History of Present Illness: Meghan Garner is a 38 year old female who presents today for a total thyroidectomy. Patient was referred by Kathlyn Sacramento. Patient has a hx of thyroid nodules. Patient had a right FNS done at [facility] which showed increased [place]thle calls. The patient states that her PCP sent her for an ultrasound due to her being diagnosed with hypothyroidism. The patient is currently taking Armor thyroid 150. The patient admits to fatigue and weight gain. The patient denies heat or cold intolerance, choking sensations, bone pain, bone fractures, head or neck radiation. The patient admits to a family history of thyroid cancer in her paternal great grandmother.   Thyroid ultrasound showed:   Right Lobe of thyroid measures: 1.42 cm x 1.40 cm x 4.84 cm    Right Lobe of thyroid is heterogenous    Right Lobe shows presence of an isoechoic solid nodule in the upper    portion measuring 1.67 x 1.12 x 1.02 cm    There is a hypoechoic solid nodule in the lower portion measuring 1.72 x    1.04 x 0.68 cm    There is a hypoechoic solid nodule in the mid portion posteriorly    measuring 1.0 x 0.76 x 0.61 cm        Left Lobe of thyroid measures: 1.58 cm x 1.42 cm x 4.46 cm    Left lobe of thyroid is heterogeneous    Left Lobe shows presence of multiple ill-defined nodules with the most    dominant being a hypoechoic nodule in the mid portion measuring 1.20 x    0.62 x 0.48 cm    There are several other ill-defined nodules.        Isthmus measures: 0.38 cm in thickness.      Past Medical History  Outpatient Medications Marked as Taking for the 01/07/24 encounter [facility] Encounter)   Medication Sig    DESOG-E.ESTRADIOL/E.ESTRADIOL ORAL Take 1 Tablet by mouth Once a  day    famotidine (PEPCID) 40 mg Oral Tablet Take 1 Tablet (40 mg total) by mouth Once a day     Allergies   Allergen Reactions    Cephalosporins Anaphylaxis    Penicillins Anaphylaxis    Metformin Rash    Sulfa (Sulfonamides) Rash    Adhesive Itching    Soap Hives/ Urticaria    Trental [Pentoxifylline] Itching     Past Medical History:   Diagnosis Date    Asthma     Cancer (CMS HCC)     thyroid cancer cells    Chronic pain     nerve pain    Esophageal reflux     Hypothyroid     Sleep apnea     unable to wear c-pap    Thyroid disorder     Trigeminal neuralgia     Type 2 diabetes mellitus (CMS HCC)     pre-diab/no meds now         Past Surgical History:   Procedure Laterality Date    HX LAP CHOLECYSTECTOMY      HX TONSILLECTOMY           Family Medical History:       Problem  Relation (Age of Onset)    Thyroid Disease Paternal Grandmother    Thyroid nodules Paternal Grandmother            Social History     Socioeconomic History    Marital status: Married   Tobacco Use    Smoking status: Never    Smokeless tobacco: Never   Vaping Use    Vaping status: Never Used   Substance and Sexual Activity    Alcohol use: Never    Drug use: Never   Other Topics Concern    Ability to Walk 1 Flight of Steps without SOB/CP Yes    Routine Exercise No    Ability to Walk 2 Flight of Steps without SOB/CP Yes    Unable to Ambulate No    Total Care No    Ability To Do Own ADL's Yes    Uses Walker No    Uses Cane No          IMMUNIZATION:   There is no immunization history on file for this patient.     Review of Systems   Constitutional:  Positive for malaise/fatigue.   HENT: Negative.     Eyes: Negative.    Respiratory:  Positive for shortness of breath.    Cardiovascular: Negative.    Gastrointestinal:  Positive for heartburn.   Musculoskeletal:  Positive for myalgias.   Neurological: Negative.    Endo/Heme/Allergies: Negative.    Psychiatric/Behavioral: Negative.          Ht 1.702 m (5\' 7" )   Wt (!) 154 kg (340 lb)   LMP 12/12/2023    BMI 53.25 kg/m         PHYSICAL EXAMINATION:  CONSTITUTIONAL: Cooperative, Well-developed, well-nourished, in no acute distress.  SKIN: Warm and dry with good turgor; no rash or open wounds observed  HEENT: head is normocephalic and atraumatic. Extraocular motions are intact.  External ear is normal.  Nares are patent.  NECK: Supple without lymphadenopathy.  Non-tender to palpation.  No thyroid nodules palpable.   RESPIRATORY: Clear to auscultation bilaterally without wheezes or rhonchi.  No tachypnea or use of accessory muscles  CARDIOVASCULAR: Heart is regular rate and rhythm without murmur, rub, or gallop.  Pulses are +2/4 and symmetrical bilaterally.  Capillary refill less than 3 seconds.  ABDOMEN: Bowel sounds are normoactive.  Abdomen is soft, non-tender, and non distended.  No rebound tenderness noted.   RECTAL: Exam deferred.  GENITOURINARY: Exam deferred.  MUSCULOSKELETAL: No obvious musculoskeletal abnormalities.  Free active range of motion without any obvious limitation or pain.   NEUROLOGIC: Cranial nerves are grossly intact without acute deficit.  No focal weakness.  Normal speech.  No fasciculations.  Moving all extremities.  Vision and hearing grossly intact.  PSYCHIATRIC: Cooperative. Normal affect.  No suicidal or homicidal ideation.      IMPRESSION: Multinodular goiter      [address] [place], [place]  01/07/2024 07:13

## 2024-01-07 NOTE — OR Surgeon (Signed)
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]    Procedure(s)/Description:  TOTAL THYROIDECTOMY: 60240 (CPT)     Attending Surgeon: Nicoletta Dress, MD     Assistant: Maximiano Coss, PA-C    Anesthesia Staff:  Anesthesiologist: Golden Hurter, MD  CRNA: Clelia Schaumann, CRNA    Anesthesia Type: General     Estimated Blood Loss: Minimal     Specimens Removed:   Order Name Source Comment Collection Info Order Time   SURGICAL PATHOLOGY SPECIMEN Thyroid Pre-op diagnosis:  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    Description of Procedure/Operation:     Patient placed in supine position.  The chest and neck was prepped and draped in the usual manner.  Incision was made at the base of the neck transversely across the midline.  The incision was deepened through subcutaneous tissue and through the platysma muscle.  Hemostasis obtained by electrocoagulation of the bleeding vessels.  Skin flaps were developed.  The deep cervical fascia was opened along the midline.  The strap muscles were retracted laterally.  Exploration of the neck showed both lobes of the thyroid gland [were enlarged and nodular].  The right lobe of the thyroid gland is significantly larger with palpable firm solid nodule.  [No significant lymphadenopathy was noted].  Total thyroidectomy was performed.  Starting on the right side of the neck the Harmonic scalpel electrocautery and hemoclips were used for  hemostasis.  The superior thyroid artery was ligated and divided numerous arterial branches from the inferior thyroid artery to the right lobe of the thyroid were ligated and divided.  The recurrent laryngeal nerve and the 2 parathyroid gland superior and inferior were identified and protected.  The isthmus of the thyroid was divided.  Numerous venous branches were ligated and divided.  The entire right lobe of the thyroid was removed.  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  Final diagnosis reserved for the final pathology examination.  The left lobe of the thyroid gland was addressed and following the same principles applied and the right side of the neck .The entire left lobe of the thyroid was removed.  The recurrent laryngeal nerve and 2 parathyroid glands on the left side of the neck were identified and protected.  The entire left lobe of the thyroid was removed   .  Hemostasis was good.  A drain placed in the neck and brought out through a separate stab wound.  Closure done in layers, the deep cervical fascia was approximated with interrupted sutures with 4 O Vicryl.  The subcutaneous tissue approximated with 4 O Vicryl.  The skin closed using running subcuticular stitch with 50 Monocryl.  Steri-Strips were applied.  Blood loss was [minimal] no complications were noted.  The patient was taken to the recovery room in a satisfactory condition thank  you      Nicoletta Dress, MD

## 2024-01-07 NOTE — Anesthesia Transfer of Care (Signed)
ANESTHESIA TRANSFER OF CARE   Meghan Garner is a 38 y.o. ,female, Weight: (!) 154 kg (340 lb)   had Procedure(s):  TOTAL THYROIDECTOMY  performed  01/07/24   Primary Service: Nicoletta Dress, MD    Past Medical History:   Diagnosis Date    Asthma     Cancer (CMS [facility])     thyroid cancer cells    Chronic pain     nerve pain    Esophageal reflux     Hypothyroid     Sleep apnea     unable to wear c-pap    Thyroid disorder     Trigeminal neuralgia     Type 2 diabetes mellitus (CMS HCC)     pre-diab/no meds now      Allergy History as of 01/07/24       CEPHALOSPORINS         Noted Status Severity Type Reaction    08/16/23 1617 Sheets, [place], CMA 04/13/19 Active High  Anaphylaxis              PENICILLINS         Noted Status Severity Type Reaction    08/16/23 1617 Sheets, [place], CMA 04/13/19 Active High  Anaphylaxis              SULFA (SULFONAMIDES)         Noted Status Severity Type Reaction    08/16/23 1617 Sheets, [place], CMA 08/16/23 Active Medium  Rash              METFORMIN         Noted Status Severity Type Reaction    08/17/23 1042 Sheets, [place], [place] 08/17/23 Active Medium  Rash              PENTOXIFYLLINE         Noted Status Severity Type Reaction    12/27/23 1445 Thomasene Lot, RN 12/27/23 Active Low  Itching              SOAP         Noted Status Severity Type Reaction    12/27/23 1445 Thomasene Lot, RN 12/27/23 Active Low  Hives/ Urticaria              ADHESIVE         Noted Status Severity Type Reaction    12/27/23 1446 Thomasene Lot, RN 12/27/23 Active Low  Itching              VORTIOXETINE         Noted Status Severity Type Reaction    01/07/24 0740 Seacrist, [place] Kidney, RN 01/07/24 Active Low  Hives/ Urticaria                  I completed my transfer of care / handoff to the receiving personnel during which we discussed:  Access, Airway, All key/critical aspects of case discussed, Analgesia, Expectation of post procedure, Fluids/Product, Gave opportunity for questions and acknowledgement of understanding,  Labs and PMHx                                                                   Last OR Temp: Temperature: 36.4 C (97.6 F)  ABG:  POTASSIUM   Date Value Ref Range Status  01/04/2024 4.1 3.5 - 5.1 mmol/L Final     CALCIUM   Date Value Ref Range Status   01/04/2024 9.3 8.6 - 10.3 mg/dL Final     Airway:* No LDAs found *  Blood pressure (!) 150/116, pulse (!) 106, temperature 36.4 C (97.6 F), resp. rate 18, height 1.702 m (5\' 7" ), weight (!) 154 kg (340 lb), last menstrual period 12/12/2023, SpO2 99%.

## 2024-01-07 NOTE — Nurses Notes (Signed)
Dr. Adaline Sill talking to patient about removing ear piercing in right ear.  Patient states "it won't come out".  Patient cautioned about risks when intubated.  Patient wants earring left intact in ear.  Taped over the earring for safety purposes.

## 2024-01-07 NOTE — Anesthesia Preprocedure Evaluation (Signed)
ANESTHESIA PRE-OP EVALUATION  Planned Procedure: TOTAL THYROIDECTOMY (Bilateral)  Review of Systems         patient summary reviewed  nursing notes reviewed        Pulmonary   asthma and sleep apnea,   Cardiovascular    Hypertension ,       GI/Hepatic/Renal    GERD        Endo/Other    hypothyroidism and morbid obesity,   type 2 diabetes    Neuro/Psych/MS    anxiety, depression     Cancer  CA,                       Physical Assessment      Airway       Mallampati: III    TM distance: >3 FB    Neck ROM: full  Mouth Opening: fair.            Dental                    Pulmonary    Breath sounds clear to auscultation       Cardiovascular      Rate: Normal       Other findings              Plan  ASA 3     Planned anesthesia type: general     general inhalational      PONV Plan:  I plan to administer pharmcologic prophalaxis antiemetics  POV PLAN:   plan for postoperative opioid use            Intravenous induction     Anesthesia issues/risks discussed are: Dental Injuries, Post-op Pain Management, Post-op Agitation/Tantrum, Aspiration, PONV, Cardiac Events/MI, Intraoperative Awareness/ Recall and Sore Throat.  Anesthetic plan and risks discussed with patient  signed consent obtained          Patient's NPO status is appropriate for Anesthesia.                     Urine Pregnancy Results: Negative (negative/lot [id number], exp 03/14/2025)

## 2024-01-07 NOTE — Anesthesia Postprocedure Evaluation (Signed)
Anesthesia Post Op Evaluation    Patient: Meghan Garner  Procedure(s):  TOTAL THYROIDECTOMY    Last Vitals:Temperature: 36.7 C (98 F) (01/07/24 1227)  Heart Rate: 100 (01/07/24 1227)  BP (Non-Invasive): (!) 147/76 (01/07/24 1227)  Respiratory Rate: 16 (01/07/24 1227)  SpO2: 97 % (01/07/24 1227)    No notable events documented.    Patient is sufficiently recovered from the effects of anesthesia to participate in the evaluation and has returned to their pre-procedure level.  Patient location during evaluation: PACU       Patient participation: complete - patient participated  Level of consciousness: awake and alert and responsive to verbal stimuli    Pain management: adequate  Airway patency: patent    Anesthetic complications: no  Cardiovascular status: acceptable  Respiratory status: acceptable  Hydration status: acceptable  Patient post-procedure temperature: Pt Normothermic   PONV Status: Absent

## 2024-01-08 ENCOUNTER — Other Ambulatory Visit: Payer: Self-pay

## 2024-01-08 DIAGNOSIS — Z79890 Hormone replacement therapy (postmenopausal): Secondary | ICD-10-CM

## 2024-01-08 LAB — IONIZED CALCIUM WITH PH
IONIZED CALCIUM: 1.06 mmol/L — ABNORMAL LOW (ref 1.15–1.33)
PH (VENOUS): 7.41 (ref 7.32–7.43)

## 2024-01-08 LAB — MAGNESIUM: MAGNESIUM: 2 mg/dL (ref 1.6–2.6)

## 2024-01-08 LAB — POTASSIUM: POTASSIUM: 4 mmol/L (ref 3.2–5.0)

## 2024-01-08 MED ORDER — CALCITRIOL 0.25 MCG CAPSULE
0.5000 ug | ORAL_CAPSULE | ORAL | Status: AC
Start: 2024-01-08 — End: 2024-01-08
  Administered 2024-01-08: 0.5 ug via ORAL
  Filled 2024-01-08: qty 2

## 2024-01-08 MED ORDER — HYDROCODONE 5 MG-ACETAMINOPHEN 325 MG TABLET
1.0000 | ORAL_TABLET | Freq: Four times a day (QID) | ORAL | 0 refills | Status: AC | PRN
Start: 2024-01-08 — End: 2024-01-16
  Filled 2024-01-08: qty 20, 5d supply, fill #0

## 2024-01-08 MED ORDER — CALCITRIOL 0.5 MCG CAPSULE
0.5000 ug | ORAL_CAPSULE | Freq: Every day | ORAL | 0 refills | Status: AC
Start: 2024-01-08 — End: 2024-02-07
  Filled 2024-01-08: qty 30, 30d supply, fill #0

## 2024-01-08 NOTE — Care Plan (Signed)
Patients VSS and PRN pain medication given. JP drain and IV removed. Discharge instructions reviewed at bedside with patient and family. Patient discharged in stable status.

## 2024-01-08 NOTE — Discharge Summary (Signed)
DISCHARGE SUMMARY      Patient Name: Meghan Garner  Sex: Female  Date of Birth: Apr 16, 1986  MRN Number: [id number]    Admission Date: 01/07/2024  Discharge Date:     Primary Care Physician: Myra Gianotti Hill-Hurt, PA-C     Discharge Diagnoses:  Multinodular goiter    Active Hospital Problems    Diagnosis    Primary Problem: Multinodular goiter     Allergies   Allergen Reactions    Cephalosporins Anaphylaxis    Penicillins Anaphylaxis    Metformin Rash    Sulfa (Sulfonamides) Rash    Adhesive Itching    Soap Hives/ Urticaria    Trental [Pentoxifylline] Itching    Trintellix [Vortioxetine] Hives/ Urticaria       Reason for Hospitalization and Hospital Course:   Final diagnosis:  Multinodular goiter.  Pending final pathology examination.  Patient is status post total thyroidectomy.  Patient is doing well.  No complaints.  Afebrile.  The wound looks good.  No swelling erythema or drainage.  The drain was removed.    Lab work was reviewed and discussed.    Patient will be discharged today.  Instructions regarding the postoperative course and wound care were given.    Follow-up at my office in 1 week.    Summary:         Discharge Medications:     Current Discharge Medication List        START taking these medications.        Details   calcitrioL 0.5 mcg Capsule  Commonly known as: ROCALTROL   0.5 mcg, Oral, DAILY  Qty: 30 Capsule  Refills: 0     HYDROcodone-acetaminophen 5-325 mg Tablet  Commonly known as: NORCO   1 Tablet, Oral, EVERY 6 HOURS PRN  Qty: 20 Tablet  Refills: 0            CONTINUE these medications - NO CHANGES were made during your visit.        Details   Baclofen 20 mg Tablet  Commonly known as: LIORESAL   20 mg, Oral, 3 TIMES DAILY  Refills: 0     buPROPion 150 mg tablet sustained-release 12 hr  Commonly known as: WELLBUTRIN SR   150 mg, Oral, 2 TIMES DAILY  Refills: 0     DESOG-E.ESTRADIOL/E.ESTRADIOL ORAL   1 Tablet, Oral, DAILY  Refills: 0     ergocalciferol (vitamin D2) 1,250 mcg (50,000 unit)  Capsule  Commonly known as: DRISDOL   50,000 Units, Oral, EVERY 7 DAYS  Qty: 13 Capsule  Refills: 1     famotidine 40 mg Tablet  Commonly known as: PEPCID   40 mg, Oral, DAILY  Refills: 0     levothyroxine 150 mcg Tablet  Commonly known as: SYNTHROID   150 mcg, Oral, EVERY MORNING  Qty: 90 Tablet  Refills: 1     OXcarbazepine 300 mg Tablet  Commonly known as: TRILEPTAL   ON DAY 1-3 TAKE 1 CAPSULE BY MOUTH TWICE A DAY, ON DAY 4-6 TAKE 1 CAPSULE IN THE AM AND 2 CAPS IN THE EVENING, ON DAY 7-9 TAKE 2 CAPS TWICE A DAY, ON DAY 10-12 TAKE 2 CAPS IN THE AM AND 3 CAPS IN THE EVENING, ON DAY 13 AND BEYOND TAKE 3 CAPS TWICE A DAY  Refills: 0     pantoprazole 40 mg Tablet, Delayed Release (E.C.)  Commonly known as: PROTONIX   40 mg, Oral, EVERY EVENING  Refills: 0     spironolactone  50 mg Tablet  Commonly known as: ALDACTONE   50 mg, Oral, 2 TIMES DAILY  Refills: 0              Follow-Up Appointments/Discharge Instructions:  Post-Discharge Follow Up Appointments       Wednesday Jan 16, 2024    Post-Operative Visit with Nicoletta Dress, MD at  1:00 PM      Friday Mar 14, 2024    Return Patient Visit with Wilmer Floor, FNP-C at 10:45 AM      Endocrinology, Medical Office Building [facility] [employer], [place]  [address]  [place] [address]  [phone] General Surgery, [facility]  [facility] [place]  [address]  [place] [address]  [phone]             POTASSIUM     Release to patient Automated [100001]      MAGNESIUM     Release to patient Automated [100001]      CALCIUM     Release to patient Automated [100001]      DISCHARGE INSTRUCTION - MISC    Discharge Instructions     1. Resume your regular home diet.    2. May shower on the 3rd day after surgery.    3. May use ice to incision for 30 minutes at a time 6 times daily as needed for the first 2 days.    4. Keep your incision dry until the third day after  your surgery.    5. No heavy lifting (more than 10 pounds) or heavy exertion for 2 weeks. You may walk, climb stairs, and      carry out light activities around the house until then. Also, no stooping or bending over from the waist for      2 weeks.    6. You may drive in 5 - 7 days. You may ride as a passenger until then.    7. Do not remove Steri strips that cover your incision. They will fall off.    8. Lab work to be done 2 days before office appointment.    9. Report any of the following signs or symptoms:     >> Increased redness.   >> Increased swelling / edema.   >> Increased body temperature (greater than or equal to 101 degrees)   >> Numbness, tingling, stiffness, or tightness of the fingers, face, or lips.    10. Call your doctor if any of these symptoms occur, even if they go away.    11. If these symptoms DO NOT go away promptly on their own, CALL AN AMBULANCE OR HAVE       SOMEONE DRIVE YOU TO THE NEAREST EMERGENCY ROOM.  12. Take Tums/calcium tablet 2000 mg twice daily.  13 .  Take calcitriol 0.5 micro g daily            **Keep scheduled follow up appointment with physician**     Discharge Instructions for Post Op Parathyroid / Thyroid Surgery Patients    1. Resume your regular home diet.    2. May shower on the 3rd day after surgery.    3. May use ice to incision for 30 minutes at a time 6 times daily as needed for the first 2 days.    4. Keep your incision dry until the third day after your surgery.    5. No heavy lifting (more than 10 pounds) or heavy  exertion for 2 weeks. You may walk, climb stairs, and      carry out light activities around the house until then. Also, no stooping or bending over from the waist for      2 weeks.    6. You may drive in 5 - 7 days. You may ride as a passenger until then.    7. Do not remove Steri strips that cover your incision. They will fall off.    8. Lab work to be done 2 days before office appointment.    9. Report any of the following signs or symptoms:     >>  Increased redness.   >> Increased swelling / edema.   >> Increased body temperature (greater than or equal to 101 degrees)   >> Numbness, tingling, stiffness, or tightness of the fingers, face, or lips.    10. Call your doctor if any of these symptoms occur, even if they go away.    11. If these symptoms DO NOT go away promptly on their own, CALL AN AMBULANCE OR HAVE       SOMEONE DRIVE YOU TO THE NEAREST EMERGENCY ROOM.  Twelve.  Take Tums/calcium tablet 2000 mg twice daily.  Thirteen .  Take calcitriol 0.5 micro g daily.            **Keep scheduled follow up appointment with physician**    Condition on Discharge:   A. Condition- Stable   B. Activity- per instruction sheet    C. Diet- Resume home diet    [facility]     Nicoletta Dress, MD  01/08/2024 07:49     Copies sent to Care Team         Relationship Specialty Notifications Start End    Hill-Hurt, Myra Gianotti, [place] PCP - General PHYSICIAN ASSISTANT Admissions 12/06/23     Phone: [phone] Fax: [phone]         [address] [place] [address]            Referring providers can utilize [email or web address] to access their referred [facility] patient's information.

## 2024-01-08 NOTE — Care Management Notes (Signed)
PT signed MOON ltr, understood ltr

## 2024-01-08 NOTE — Care Management Notes (Signed)
01/08/24 0928   Assessment Details   Assessment Type Admission   Date of Care Management Update 01/08/24   Readmission   Is this a readmission? No   Insurance Information/Type   Insurance type Medicare   Employment/Financial   Financial/Environmental Concerns none   Living Environment   Lives With spouse   Living Arrangements house   Able to Return to Prior Arrangements yes   Home Safety   Home Assessment: No Problems Identified   Home Accessibility no concerns   Custody and Legal Status   Do you have a court appointed guardian/conservator? No   Are you an emancipated minor? No   Custody Issues? No   Paternity Affidavit Requested? No   Care Management Plan   Discharge Planning Status initial meeting   Projected Discharge Date 01/08/24   Discharge plan discussed with: Patient   CM will evaluate for rehabilitation potential yes   Patient choice offered to patient/family no   Form for patient choice reviewed/signed and on chart no   Patient aware of possible cost for ambulance transport?  No   Discharge Needs Assessment   Equipment Currently Used at Home none   Equipment Needed After Discharge none   Discharge Facility/Level of Care Needs Home (Patient/Family Member/other)(code 1)   Transportation Available family or friend will provide   Transportation Arranged None   Referral Information   Admission Type observation   ADVANCE DIRECTIVES   Does the Patient have an Advance Directive? No, Information Offered and Given   Patient Requests Assistance in Having Advance Directive Notarized. N/A   LAY CAREGIVER    Appointed Lay Caregiver? I Decline     JP drain will be discontinued.  Will check if she tolerated well. Possible discharge today.  No needs at this time

## 2024-01-09 DIAGNOSIS — D34 Benign neoplasm of thyroid gland: Secondary | ICD-10-CM

## 2024-01-09 LAB — SURGICAL PATHOLOGY SPECIMEN

## 2024-01-15 ENCOUNTER — Ambulatory Visit: Payer: Medicare PPO | Attending: PHYSICIAN ASSISTANT

## 2024-01-15 ENCOUNTER — Other Ambulatory Visit: Payer: Self-pay

## 2024-01-15 ENCOUNTER — Ambulatory Visit (HOSPITAL_COMMUNITY): Payer: Medicare PPO

## 2024-01-15 DIAGNOSIS — E042 Nontoxic multinodular goiter: Secondary | ICD-10-CM | POA: Insufficient documentation

## 2024-01-15 DIAGNOSIS — I10 Essential (primary) hypertension: Secondary | ICD-10-CM | POA: Insufficient documentation

## 2024-01-15 DIAGNOSIS — E079 Disorder of thyroid, unspecified: Secondary | ICD-10-CM | POA: Insufficient documentation

## 2024-01-15 LAB — PARATHYROID HORMONE (PTH): PTH: 22.3 pg/mL (ref 12.0–88.0)

## 2024-01-15 LAB — COMPREHENSIVE METABOLIC PANEL, NON-FASTING
ALBUMIN/GLOBULIN RATIO: 1.5 — ABNORMAL HIGH (ref 0.8–1.4)
ALBUMIN: 4.3 g/dL (ref 3.5–5.7)
ALKALINE PHOSPHATASE: 57 U/L (ref 34–104)
ALT (SGPT): 16 U/L (ref 7–52)
ANION GAP: 13 mmol/L (ref 4–13)
AST (SGOT): 14 U/L (ref 13–39)
BILIRUBIN TOTAL: 0.3 mg/dL (ref 0.3–1.0)
BUN/CREA RATIO: 17 (ref 6–22)
BUN: 17 mg/dL (ref 7–25)
CALCIUM, CORRECTED: 9.2 mg/dL (ref 8.9–10.8)
CALCIUM: 9.4 mg/dL (ref 8.6–10.3)
CHLORIDE: 101 mmol/L (ref 98–107)
CO2 TOTAL: 25 mmol/L (ref 21–31)
CREATININE: 1.02 mg/dL (ref 0.60–1.30)
ESTIMATED GFR: 73 mL/min/{1.73_m2} (ref >59)
GLOBULIN: 2.9 (ref 2.0–3.5)
GLUCOSE: 92 mg/dL (ref 74–109)
OSMOLALITY, CALCULATED: 279 mosm/kg (ref 270–290)
POTASSIUM: 4.1 mmol/L (ref 3.5–5.1)
PROTEIN TOTAL: 7.2 g/dL (ref 6.4–8.9)
SODIUM: 139 mmol/L (ref 136–145)

## 2024-01-15 LAB — T-UPTAKE: T-UPTAKE: 48.2 % (ref 32.0–48.4)

## 2024-01-15 LAB — THYROID STIMULATING HORMONE (SENSITIVE TSH): TSH: 15.701 u[IU]/mL — ABNORMAL HIGH (ref 0.450–5.330)

## 2024-01-15 LAB — MAGNESIUM: MAGNESIUM: 1.7 mg/dL — ABNORMAL LOW (ref 1.9–2.7)

## 2024-01-15 LAB — THYROXINE, FREE (FREE T4): THYROXINE (T4), FREE: 0.58 ng/dL — ABNORMAL LOW (ref 0.61–1.12)

## 2024-01-16 ENCOUNTER — Ambulatory Visit: Payer: Medicare PPO | Attending: GENERAL SURGERY | Admitting: GENERAL SURGERY

## 2024-01-16 ENCOUNTER — Encounter (HOSPITAL_BASED_OUTPATIENT_CLINIC_OR_DEPARTMENT_OTHER): Payer: Self-pay | Admitting: GENERAL SURGERY

## 2024-01-16 DIAGNOSIS — Z09 Encounter for follow-up examination after completed treatment for conditions other than malignant neoplasm: Secondary | ICD-10-CM | POA: Insufficient documentation

## 2024-01-16 DIAGNOSIS — Z4889 Encounter for other specified surgical aftercare: Secondary | ICD-10-CM

## 2024-01-16 DIAGNOSIS — E042 Nontoxic multinodular goiter: Secondary | ICD-10-CM

## 2024-01-16 DIAGNOSIS — E876 Hypokalemia: Secondary | ICD-10-CM | POA: Insufficient documentation

## 2024-01-16 DIAGNOSIS — E890 Postprocedural hypothyroidism: Secondary | ICD-10-CM | POA: Insufficient documentation

## 2024-01-16 NOTE — Nursing Note (Signed)
Devonne Santellano is a 38 year old female here today for a post operative follow up for a total thyroidectomy done on 01/07/24. The patient was referred by Kathlyn Sacramento. The indication for the procedure was multinodular goiter. The pathology results to the left and right lobes of the thyroid are benign thyroid follicular nodular disease; no evidence of malignancy. The patient states that her PCP sent her for an ultrasound due to her being diagnosed with hypothyroidism. The patient admits to fatigue and weight gain. Prior to surgery the patient denied heat or cold intolerance, choking sensations, bone pain, bone fractures, head or neck radiation. The patient admits to a family history of thyroid cancer in her paternal great grandmother. Labs done on      Patient states she is taking Calcitriol 0.5mg  and Synthroid 150mg  daily. She is having tightness in her throat and having pain 4/10. She feels like there is something stuff which is new as of today. She is also having ongoing fatigue.   Patient denies a choking sensation and having muscle pain and headache and muscle spasms to her upper back area.

## 2024-01-16 NOTE — Progress Notes (Signed)
[facility]  [address]  [place] [address]  Operated by [facility]     Name: Meghan Garner MRN:  [id number]   Date of Birth: 1986-05-25 Age: 38 y.o.   Date: 01/16/2024       Provider: Nicoletta Dress, MD    PCP: Myra Gianotti Hill-Hurt, PA-C    Referring Provider: No ref. provider found     Reason for visit: Post Op (Total thyroidectomy)      HPI:  Nursing Notes:   Carolynn Comment, LPN  30/86/57 [id number]  Signed  [name] is a 38 year old female here today for a post operative follow up for a total thyroidectomy done on 01/07/24. The patient was referred by Kathlyn Sacramento. The indication for the procedure was multinodular goiter. The pathology results to the left and right lobes of the thyroid are benign thyroid follicular nodular disease; no evidence of malignancy. The patient states that her PCP sent her for an ultrasound due to her being diagnosed with hypothyroidism. The patient admits to fatigue and weight gain. Prior to surgery the patient denied heat or cold intolerance, choking sensations, bone pain, bone fractures, head or neck radiation. The patient admits to a family history of thyroid cancer in her paternal great grandmother. Labs done on      Patient states she is taking Calcitriol 0.5mg  and Synthroid 150mg  daily. She is having tightness in her throat and having pain 4/10. She feels like there is something stuff which is new as of today. She is also having ongoing fatigue.   Patient denies a choking sensation and having muscle pain and headache and muscle spasms to her upper back area.      Past Medical History:  Past Medical History:   Diagnosis Date    Asthma     Cancer (CMS HCC)     thyroid cancer cells    Chronic pain     nerve pain    Esophageal reflux     Hypothyroid     Sleep apnea     unable to wear c-pap    Thyroid disorder     Trigeminal neuralgia     Type 2 diabetes mellitus (CMS HCC)     pre-diab/no meds now     Past Surgical History:    Procedure Laterality Date    HX LAP CHOLECYSTECTOMY      HX TONSILLECTOMY        Allergies   Allergen Reactions    Cephalosporins Anaphylaxis    Penicillins Anaphylaxis    Metformin Rash    Sulfa (Sulfonamides) Rash    Adhesive Itching    Soap Hives/ Urticaria    Trental [Pentoxifylline] Itching    Trintellix [Vortioxetine] Hives/ Urticaria     Current Outpatient Medications   Medication Sig    Baclofen (LIORESAL) 20 mg Oral Tablet Take 1 Tablet (20 mg total) by mouth Three times a day    buPROPion (WELLBUTRIN SR) 150 mg Oral tablet sustained-release 12 hr Take 1 Tablet (150 mg total) by mouth Twice daily    calcitrioL (ROCALTROL) 0.5 mcg Oral Capsule Take 1 Capsule (0.5 mcg total) by mouth Once a day for 30 days    DESOG-E.ESTRADIOL/E.ESTRADIOL ORAL Take 1 Tablet by mouth Once a day    ergocalciferol, vitamin D2, (DRISDOL) 1,250 mcg (50,000 unit) Oral Capsule Take 1 Capsule (50,000 Units total) by mouth Every 7 days    famotidine (PEPCID) 40 mg Oral Tablet Take 1 Tablet (40 mg total)  by mouth Once a day    HYDROcodone-acetaminophen (NORCO) 5-325 mg Oral Tablet Take 1 Tablet by mouth Every 6 hours as needed for Pain for up to 7 days    levothyroxine (SYNTHROID) 150 mcg Oral Tablet Take 1 Tablet (150 mcg total) by mouth Every morning    OXcarbazepine (TRILEPTAL) 300 mg Oral Tablet ON DAY 1-3 TAKE 1 CAPSULE BY MOUTH TWICE A DAY, ON DAY 4-6 TAKE 1 CAPSULE IN THE AM AND 2 CAPS IN THE EVENING, ON DAY 7-9 TAKE 2 CAPS TWICE A DAY, ON DAY 10-12 TAKE 2 CAPS IN THE AM AND 3 CAPS IN THE EVENING, ON DAY 13 AND BEYOND TAKE 3 CAPS TWICE A DAY    pantoprazole (PROTONIX) 40 mg Oral Tablet, Delayed Release (E.C.) Take 1 Tablet (40 mg total) by mouth Every evening    spironolactone (ALDACTONE) 50 mg Oral Tablet Take 1 Tablet (50 mg total) by mouth Twice daily     Family History       Problem Relation Age of Onset Comments    Thyroid Disease Paternal Grandmother      Thyroid nodules Paternal Grandmother             Social History      Socioeconomic History    Marital status: Married   Tobacco Use    Smoking status: Never    Smokeless tobacco: Never   Vaping Use    Vaping status: Never Used   Substance and Sexual Activity    Alcohol use: Never    Drug use: Never   Other Topics Concern    Ability to Walk 1 Flight of Steps without SOB/CP Yes    Routine Exercise No    Ability to Walk 2 Flight of Steps without SOB/CP Yes    Unable to Ambulate No    Total Care No    Ability To Do Own ADL's Yes    Uses Walker No    Uses Cane No     Social Determinants of Health     Social Connections: Low Risk  (01/07/2024)    Social Connections     SDOH Social Isolation: 5 or more times a week       Review of System  The pertinent ROS as addressed in the HPI.    Vital Signs:  Temperature: 36.3 C (97.3 F)  Heart Rate: 70  BP (Non-Invasive): (!) 167/101  Respiratory Rate: 14  SpO2: 98 %      Physical Exam:  Physical Exam      GENERAL  Constitutional: no acute distress, average body habitus and cooperative.  Speech clear and appropriate.  HEENT  Eye: Present EOMI, PERRL, normal accomodation and conjunctiva pink; Absent scleral icterus.  NECK  Neck: Present full ROM and norma inspection; Absent lymphedema or tenderness.  Scar well healed the base of the neck status post total thyroidectomy.  The wound looks good.  No swelling erythema or drainage.   LUNGS  Respiratory: Clear to auscultation bilaterally. No wheezes or rhonchi. No accessory muscle use noted.  CV  Cardiovascular exam: Present RRR, S1 and S2; absent murmur, gallop, or rubs.  ABD  Abdominal: Abdomen soft, nontender. Good bowel sounds x 4. No organomegaly appreciated.  EXT  Extremities: Present full ROM, normal capillary refiill and normal inspection; Absent clubbing or pedal edema  NEUROLOGICAL: Cranial nerves grossly intact.   NEURO  Neurological:Present alert, oriented X 3 CN II-XII intact and norma lreflexes, moving all extremities, normal tone and normal speech  PSYC  Psychiatric: Present normal  affect, normal thought process, cooperative, good insight and good judgement, Absent depressed, anxious or agitated.     Assessment:  ENCOUNTER DIAGNOSES     ICD-10-CM   1. Hypocalcemia  E83.51   2. S/P total thyroidectomy  E89.0   3. Hypomagnesemia  E83.42   4. Hypokalemia  E87.6   5. Postoperative follow-up  Z09         Clinic Ultrasound:  Recent Results (from the past 8765 hour(s))   CLINIC POCUS THYROID    Collection Time: 10/12/23 12:06 PM    Narrative    CLINIC POCUS THYROID performed on [facility] [place] on Oct 12, 2023 12:06   PM.  Thyroid Ultrasound  Technique: Thyroid ultrasound was performed using the Sonosite machine   Grey scale with the linear transducer and color doppler.    Indication: Thyroid nodule    Right Lobe of thyroid measures: 1.42 cm x 1.40 cm x 4.84 cm  Right Lobe of thyroid is heterogenous  Right Lobe shows presence of an isoechoic solid nodule in the upper   portion measuring 1.67 x 1.12 x 1.02 cm  There is a hypoechoic solid nodule in the lower portion measuring 1.72 x   1.04 x 0.68 cm  There is a hypoechoic solid nodule in the mid portion posteriorly   measuring 1.0 x 0.76 x 0.61 cm      Left Lobe of  thyroid measures: 1.58 cm x 1.42 cm x 4.46 cm  Left lobe of thyroid is heterogeneous  Left Lobe shows presence of multiple ill defined nodules with the most   dominant being a hypoechoic nodule in the mid portion measuring 1.20 x   0.62 x 0.48 cm  There are several other ill defined nodules.     Isthmus measures: 0.38 cm in thickness.    Fine needle aspiration of the thyroid lesion no              Plan:  Orders Placed This Encounter    CALCIUM    POTASSIUM    MAGNESIUM         She is here for postoperative follow-up.  She is status post total thyroidectomy.  Pathology report was benign thyroid follicular nodular disease.  Negative for malignancy.    The postoperative course has been good and uneventful.  She has no complaints.  The wound looks good.  No swelling erythema or drainage.  We  discussed the operation, the postoperative course and wound care.  She has been taking Synthroid 150 micro g daily prior to surgery and she has continued to take the same dose.  On 01/15/2024 she had lab work which included TSH at 15.701 T4 0.55.  She also had calcium ionized measured 1.06.  She is presently taking calcitriol 0.5 micro g daily.  She has not taken any calcium supplement.  Our plan to increase her Synthroid to 175 micro g daily which will be monitored by TSH measurement.  The 1st test will be done in 1 month.  She was also ordered to take Tums 2000 mg once daily which will be monitored by repeated lab work every week for 3 additional weeks.  She will be discharged to continue her follow-up with Ms. Kathlyn Sacramento for continued management postoperative hypothyroidism

## 2024-01-23 ENCOUNTER — Ambulatory Visit: Payer: Medicare PPO | Attending: GENERAL SURGERY

## 2024-01-23 ENCOUNTER — Other Ambulatory Visit: Payer: Self-pay

## 2024-01-23 DIAGNOSIS — E876 Hypokalemia: Secondary | ICD-10-CM | POA: Insufficient documentation

## 2024-01-23 LAB — CALCIUM: CALCIUM: 9 mg/dL (ref 8.6–10.3)

## 2024-01-23 LAB — POTASSIUM: POTASSIUM: 3.7 mmol/L (ref 3.5–5.1)

## 2024-01-23 LAB — MAGNESIUM: MAGNESIUM: 1.7 mg/dL — ABNORMAL LOW (ref 1.9–2.7)

## 2024-01-28 ENCOUNTER — Other Ambulatory Visit (HOSPITAL_COMMUNITY): Payer: Self-pay

## 2024-01-28 DIAGNOSIS — G500 Trigeminal neuralgia: Secondary | ICD-10-CM

## 2024-01-31 ENCOUNTER — Other Ambulatory Visit (HOSPITAL_BASED_OUTPATIENT_CLINIC_OR_DEPARTMENT_OTHER): Payer: Self-pay | Admitting: GENERAL SURGERY

## 2024-01-31 ENCOUNTER — Ambulatory Visit: Payer: Medicare PPO

## 2024-01-31 ENCOUNTER — Other Ambulatory Visit: Payer: Self-pay

## 2024-01-31 DIAGNOSIS — E876 Hypokalemia: Secondary | ICD-10-CM | POA: Insufficient documentation

## 2024-01-31 LAB — MAGNESIUM: MAGNESIUM: 1.7 mg/dL — ABNORMAL LOW (ref 1.9–2.7)

## 2024-01-31 LAB — CALCIUM: CALCIUM: 8.9 mg/dL (ref 8.6–10.3)

## 2024-01-31 LAB — POTASSIUM: POTASSIUM: 4.2 mmol/L (ref 3.5–5.1)

## 2024-02-07 ENCOUNTER — Other Ambulatory Visit: Payer: Self-pay

## 2024-02-07 ENCOUNTER — Ambulatory Visit: Payer: Medicare PPO | Attending: GENERAL SURGERY

## 2024-02-07 DIAGNOSIS — E876 Hypokalemia: Secondary | ICD-10-CM | POA: Insufficient documentation

## 2024-02-07 LAB — POTASSIUM: POTASSIUM: 4 mmol/L (ref 3.5–5.1)

## 2024-02-07 LAB — MAGNESIUM: MAGNESIUM: 1.8 mg/dL — ABNORMAL LOW (ref 1.9–2.7)

## 2024-02-07 LAB — CALCIUM: CALCIUM: 9.3 mg/dL (ref 8.6–10.3)

## 2024-02-20 ENCOUNTER — Other Ambulatory Visit: Payer: Self-pay

## 2024-02-20 ENCOUNTER — Ambulatory Visit (HOSPITAL_BASED_OUTPATIENT_CLINIC_OR_DEPARTMENT_OTHER): Payer: Self-pay | Admitting: NURSE PRACTITIONER

## 2024-02-20 ENCOUNTER — Other Ambulatory Visit (HOSPITAL_BASED_OUTPATIENT_CLINIC_OR_DEPARTMENT_OTHER): Payer: Self-pay | Admitting: GENERAL SURGERY

## 2024-02-20 ENCOUNTER — Ambulatory Visit: Payer: Medicare PPO | Attending: GENERAL SURGERY

## 2024-02-20 DIAGNOSIS — E559 Vitamin D deficiency, unspecified: Secondary | ICD-10-CM | POA: Insufficient documentation

## 2024-02-20 DIAGNOSIS — Z6841 Body Mass Index (BMI) 40.0 and over, adult: Secondary | ICD-10-CM | POA: Insufficient documentation

## 2024-02-20 DIAGNOSIS — E890 Postprocedural hypothyroidism: Secondary | ICD-10-CM | POA: Insufficient documentation

## 2024-02-20 DIAGNOSIS — I10 Essential (primary) hypertension: Secondary | ICD-10-CM | POA: Insufficient documentation

## 2024-02-20 DIAGNOSIS — E876 Hypokalemia: Secondary | ICD-10-CM | POA: Insufficient documentation

## 2024-02-20 DIAGNOSIS — E0590 Thyrotoxicosis, unspecified without thyrotoxic crisis or storm: Secondary | ICD-10-CM | POA: Insufficient documentation

## 2024-02-20 DIAGNOSIS — E781 Pure hyperglyceridemia: Secondary | ICD-10-CM | POA: Insufficient documentation

## 2024-02-20 DIAGNOSIS — R739 Hyperglycemia, unspecified: Secondary | ICD-10-CM | POA: Insufficient documentation

## 2024-02-20 DIAGNOSIS — E042 Nontoxic multinodular goiter: Secondary | ICD-10-CM | POA: Insufficient documentation

## 2024-02-20 LAB — CBC WITH DIFF
BASOPHIL #: 0.1 10*3/uL (ref 0.00–0.10)
BASOPHIL %: 1 % (ref 0–1)
EOSINOPHIL #: 0.2 10*3/uL (ref 0.00–0.50)
EOSINOPHIL %: 2 % (ref 1–7)
HCT: 42.2 % — ABNORMAL HIGH (ref 31.2–41.9)
HGB: 14 g/dL (ref 10.9–14.3)
LYMPHOCYTE #: 2.4 10*3/uL (ref 1.10–3.10)
LYMPHOCYTE %: 27 % (ref 16–46)
MCH: 28.7 pg (ref 24.7–32.8)
MCHC: 33.2 g/dL (ref 32.3–35.6)
MCV: 86.6 fL (ref 75.5–95.3)
MONOCYTE #: 0.7 10*3/uL (ref 0.20–0.90)
MONOCYTE %: 8 % (ref 4–11)
MPV: 6.6 fL — ABNORMAL LOW (ref 7.9–10.8)
NEUTROPHIL #: 5.4 10*3/uL (ref 1.90–8.20)
NEUTROPHIL %: 62 % (ref 43–77)
PLATELETS: 383 10*3/uL (ref 140–440)
RBC: 4.87 10*6/uL (ref 3.63–4.92)
RDW: 13.7 % (ref 12.3–17.7)
WBC: 8.7 10*3/uL (ref 3.8–11.8)

## 2024-02-20 LAB — COMPREHENSIVE METABOLIC PANEL, NON-FASTING
ALBUMIN/GLOBULIN RATIO: 1.5 — ABNORMAL HIGH (ref 0.8–1.4)
ALBUMIN: 4.2 g/dL (ref 3.5–5.7)
ALKALINE PHOSPHATASE: 60 U/L (ref 34–104)
ALT (SGPT): 21 U/L (ref 7–52)
ANION GAP: 8 mmol/L (ref 4–13)
AST (SGOT): 19 U/L (ref 13–39)
BILIRUBIN TOTAL: 0.3 mg/dL (ref 0.3–1.0)
BUN/CREA RATIO: 20 (ref 6–22)
BUN: 19 mg/dL (ref 7–25)
CALCIUM, CORRECTED: 8.9 mg/dL (ref 8.9–10.8)
CALCIUM: 9.1 mg/dL (ref 8.6–10.3)
CHLORIDE: 102 mmol/L (ref 98–107)
CO2 TOTAL: 27 mmol/L (ref 21–31)
CREATININE: 0.93 mg/dL (ref 0.60–1.30)
ESTIMATED GFR: 82 mL/min/{1.73_m2} (ref >59)
GLOBULIN: 2.8 (ref 2.0–3.5)
GLUCOSE: 95 mg/dL (ref 74–109)
OSMOLALITY, CALCULATED: 276 mosm/kg (ref 270–290)
POTASSIUM: 4.5 mmol/L (ref 3.5–5.1)
PROTEIN TOTAL: 7 g/dL (ref 6.4–8.9)
SODIUM: 137 mmol/L (ref 136–145)

## 2024-02-20 LAB — HGA1C (HEMOGLOBIN A1C WITH EST AVG GLUCOSE): HEMOGLOBIN A1C: 5.6 % (ref 4.0–6.0)

## 2024-02-20 LAB — THYROXINE, FREE (FREE T4): THYROXINE (T4), FREE: 0.63 ng/dL (ref 0.61–1.12)

## 2024-02-20 LAB — MAGNESIUM: MAGNESIUM: 1.8 mg/dL — ABNORMAL LOW (ref 1.9–2.7)

## 2024-02-20 LAB — THYROID STIMULATING HORMONE WITH FREE T4 REFLEX: TSH: 8.096 u[IU]/mL — ABNORMAL HIGH (ref 0.450–5.330)

## 2024-02-20 LAB — VITAMIN D 25 TOTAL: VITAMIN D 25, TOTAL: 39.39 ng/mL (ref 30.00–100.00)

## 2024-02-21 ENCOUNTER — Other Ambulatory Visit (HOSPITAL_BASED_OUTPATIENT_CLINIC_OR_DEPARTMENT_OTHER): Payer: Self-pay | Admitting: GENERAL SURGERY

## 2024-02-21 DIAGNOSIS — E890 Postprocedural hypothyroidism: Secondary | ICD-10-CM

## 2024-02-22 LAB — TRIIODOTHYRONINE, TOTAL (TOTAL T3): T3 TOTAL: 83 ng/dL (ref 80–200)

## 2024-02-22 NOTE — Result Encounter Note (Signed)
 PT DOES NOT WANT TO BE SCHEDULED WITH THIS OFFICE. NO REASON WAS GIVEN.

## 2024-03-14 ENCOUNTER — Ambulatory Visit (HOSPITAL_BASED_OUTPATIENT_CLINIC_OR_DEPARTMENT_OTHER): Payer: Self-pay | Admitting: NURSE PRACTITIONER

## 2024-04-02 ENCOUNTER — Other Ambulatory Visit (HOSPITAL_COMMUNITY): Payer: Self-pay | Admitting: PHYSICIAN ASSISTANT

## 2024-04-02 DIAGNOSIS — M25511 Pain in right shoulder: Secondary | ICD-10-CM

## 2024-05-16 ENCOUNTER — Ambulatory Visit: Attending: GENERAL SURGERY

## 2024-05-16 ENCOUNTER — Other Ambulatory Visit: Payer: Self-pay

## 2024-05-16 DIAGNOSIS — E890 Postprocedural hypothyroidism: Secondary | ICD-10-CM | POA: Insufficient documentation

## 2024-05-16 LAB — THYROXINE, FREE (FREE T4): THYROXINE (T4), FREE: 0.52 ng/dL — ABNORMAL LOW (ref 0.61–1.12)

## 2024-05-16 LAB — THYROID STIMULATING HORMONE WITH FREE T4 REFLEX: TSH: 15.733 u[IU]/mL — ABNORMAL HIGH (ref 0.450–5.330)

## 2024-05-21 ENCOUNTER — Other Ambulatory Visit (HOSPITAL_BASED_OUTPATIENT_CLINIC_OR_DEPARTMENT_OTHER): Payer: Self-pay | Admitting: GENERAL SURGERY

## 2024-05-22 ENCOUNTER — Ambulatory Visit (HOSPITAL_BASED_OUTPATIENT_CLINIC_OR_DEPARTMENT_OTHER): Payer: Self-pay | Admitting: NURSE PRACTITIONER

## 2024-11-14 ENCOUNTER — Other Ambulatory Visit (HOSPITAL_COMMUNITY): Payer: Self-pay | Admitting: Orthopaedic Surgery

## 2024-11-14 DIAGNOSIS — M79672 Pain in left foot: Secondary | ICD-10-CM

## 2024-11-18 ENCOUNTER — Ambulatory Visit
Admission: RE | Admit: 2024-11-18 | Discharge: 2024-11-18 | Disposition: A | Discharge status: Home / Self Care | Source: Ambulatory Visit | Attending: Orthopaedic Surgery | Admitting: Orthopaedic Surgery

## 2024-11-18 ENCOUNTER — Other Ambulatory Visit: Payer: Self-pay

## 2024-11-18 DIAGNOSIS — M79672 Pain in left foot: Secondary | ICD-10-CM | POA: Insufficient documentation

## 2024-12-03 ENCOUNTER — Ambulatory Visit (HOSPITAL_COMMUNITY)
